# Patient Record
Sex: Male | Born: 1945 | Race: Black or African American | Hispanic: No | Marital: Married | Smoking: Former smoker
Health system: Southern US, Community
[De-identification: ages and names within clinical notes are randomized; demographics above are authoritative.]

## PROBLEM LIST (undated history)

## (undated) DIAGNOSIS — T7840XA Allergy, unspecified, initial encounter: Secondary | ICD-10-CM

## (undated) DIAGNOSIS — M1712 Unilateral primary osteoarthritis, left knee: Secondary | ICD-10-CM

## (undated) DIAGNOSIS — N189 Chronic kidney disease, unspecified: Secondary | ICD-10-CM

## (undated) DIAGNOSIS — I10 Essential (primary) hypertension: Secondary | ICD-10-CM

## (undated) DIAGNOSIS — D649 Anemia, unspecified: Secondary | ICD-10-CM

## (undated) DIAGNOSIS — N429 Disorder of prostate, unspecified: Secondary | ICD-10-CM

## (undated) HISTORY — DX: Chronic kidney disease, unspecified: N18.9

## (undated) HISTORY — PX: KNEE ARTHROSCOPY: SHX127

---

## 2014-05-10 NOTE — Patient Instructions (Signed)
Brent Payne  05/10/2014   Your procedure is scheduled on:  05/16/2014  Report to [facility] at 10:30 AM.  Call this number if you have problems the morning of surgery: [phone]   Remember:   Do not eat food or drink liquids after midnight.   Take these medicines the morning of surgery with A SIP OF WATER:Flomax   Do not wear jewelry, make-up or nail polish.  Do not wear lotions, powders, or perfumes. You may wear deodorant.  Do not shave 48 hours prior to surgery. Men may shave face and neck.  Do not bring valuables to the hospital.  [facility] is not responsible for any belongings or valuables.               Contacts, dentures or bridgework may not be worn into surgery.  Leave suitcase in the car. After surgery it may be brought to your room.  For patients admitted to the hospital, discharge time is determined by your treatment team.               Patients discharged the day of surgery will not be allowed to drive home.  Name and phone number of your driver:   Special Instructions: N/A   Please read over the following fact sheets that you were given: Anesthesia Post-op Instructions   PATIENT INSTRUCTIONS POST-ANESTHESIA  IMMEDIATELY FOLLOWING SURGERY:  Do not drive or operate machinery for the first twenty four hours after surgery.  Do not make any important decisions for twenty four hours after surgery or while taking narcotic pain medications or sedatives.  If you develop intractable nausea and vomiting or a severe headache please notify your doctor immediately.  FOLLOW-UP:  Please make an appointment with your surgeon as instructed. You do not need to follow up with anesthesia unless specifically instructed to do so.  WOUND CARE INSTRUCTIONS (if applicable):  Keep a dry clean dressing on the anesthesia/puncture wound site if there is drainage.  Once the wound has quit draining you may leave it open to air.  Generally you should leave the bandage intact for twenty four  hours unless there is drainage.  If the epidural site drains for more than 36-48 hours please call the anesthesia department.  QUESTIONS?:  Please feel free to call your physician or the hospital operator if you have any questions, and they will be happy to assist you.      Cataract Surgery  A cataract is a clouding of the lens of the eye. When a lens becomes cloudy, vision is reduced based on the degree and nature of the clouding. Surgery may be needed to improve vision. Surgery removes the cloudy lens and usually replaces it with a substitute lens (intraocular lens, IOL). LET YOUR EYE DOCTOR KNOW ABOUT:  Allergies to food or medicine.  Medicines taken including herbs, eye drops, over-the-counter medicines, and creams.  Use of steroids (by mouth or creams).  Previous problems with anesthetics or numbing medicine.  History of bleeding problems or blood clots.  Previous surgery.  Other health problems, including diabetes and kidney problems.  Possibility of pregnancy, if this applies. RISKS AND COMPLICATIONS  Infection.  Inflammation of the eyeball (endophthalmitis) that can spread to both eyes (sympathetic ophthalmia).  Poor wound healing.  If an IOL is inserted, it can later fall out of proper position. This is very uncommon.  Clouding of the part of your eye that holds an IOL in place. This is called an "after-cataract." These are  uncommon but easily treated. BEFORE THE PROCEDURE  Do not eat or drink anything except small amounts of water for 8 to 12 before your surgery, or as directed by your caregiver.  Unless you are told otherwise, continue any eye drops you have been prescribed.  Talk to your primary caregiver about all other medicines that you take (both prescription and nonprescription). In some cases, you may need to stop or change medicines near the time of your surgery. This is most important if you are taking blood-thinning medicine.Do not stop medicines unless  you are told to do so.  Arrange for someone to drive you to and from the procedure.  Do not put contact lenses in either eye on the day of your surgery. PROCEDURE There is more than one method for safely removing a cataract. Your doctor can explain the differences and help determine which is best for you. Phacoemulsification surgery is the most common form of cataract surgery.  An injection is given behind the eye or eye drops are given to make this a painless procedure.  A small cut (incision) is made on the edge of the clear, dome-shaped surface that covers the front of the eye (cornea).  A tiny probe is painlessly inserted into the eye. This device gives off ultrasound waves that soften and break up the cloudy center of the lens. This makes it easier for the cloudy lens to be removed by suction.  An IOL may be implanted.  The normal lens of the eye is covered by a clear capsule. Part of that capsule is intentionally left in the eye to support the IOL.  Your surgeon may or may not use stitches to close the incision. There are other forms of cataract surgery that require a larger incision and stitches to close the eye. This approach is taken in cases where the doctor feels that the cataract cannot be easily removed using phacoemulsification. AFTER THE PROCEDURE  When an IOL is implanted, it does not need care. It becomes a permanent part of your eye and cannot be seen or felt.  Your doctor will schedule follow-up exams to check on your progress.  Review your other medicines with your doctor to see which can be resumed after surgery.  Use eye drops or take medicine as prescribed by your doctor. Document Released: 12/26/2010 Document Revised: 05/23/2013 Document Reviewed: 12/26/2010 [facility] Patient Information 2015 [place], [place]. This information is not intended to replace advice given to you by your health care provider. Make sure you discuss any questions you have with your health  care provider.

## 2014-05-11 ENCOUNTER — Other Ambulatory Visit: Payer: Self-pay

## 2014-05-11 ENCOUNTER — Encounter (HOSPITAL_COMMUNITY)
Admission: RE | Admit: 2014-05-11 | Discharge: 2014-05-11 | Disposition: A | Discharge status: Home / Self Care | Payer: Commercial Managed Care - HMO | Source: Ambulatory Visit | Attending: Ophthalmology | Admitting: Ophthalmology

## 2014-05-11 ENCOUNTER — Encounter (HOSPITAL_COMMUNITY): Payer: Self-pay

## 2014-05-11 DIAGNOSIS — Z01818 Encounter for other preprocedural examination: Secondary | ICD-10-CM | POA: Diagnosis present

## 2014-05-11 LAB — BASIC METABOLIC PANEL
Anion gap: 5 (ref 5–15)
BUN: 10 mg/dL (ref 6–23)
CO2: 28 mmol/L (ref 19–32)
CREATININE: 1.08 mg/dL (ref 0.50–1.35)
Calcium: 9.1 mg/dL (ref 8.4–10.5)
Chloride: 106 mmol/L (ref 96–112)
GFR calc non Af Amer: 68 mL/min — ABNORMAL LOW (ref >90)
GFR, EST AFRICAN AMERICAN: 78 mL/min — AB (ref >90)
Glucose, Bld: 110 mg/dL — ABNORMAL HIGH (ref 70–99)
Potassium: 4.4 mmol/L (ref 3.5–5.1)
Sodium: 139 mmol/L (ref 135–145)

## 2014-05-11 LAB — CBC
HCT: 39.2 % (ref 39.0–52.0)
Hemoglobin: 12.7 g/dL — ABNORMAL LOW (ref 13.0–17.0)
MCH: 30.9 pg (ref 26.0–34.0)
MCHC: 32.4 g/dL (ref 30.0–36.0)
MCV: 95.4 fL (ref 78.0–100.0)
PLATELETS: 277 10*3/uL (ref 150–400)
RBC: 4.11 MIL/uL — AB (ref 4.22–5.81)
RDW: 15.7 % — ABNORMAL HIGH (ref 11.5–15.5)
WBC: 12 10*3/uL — ABNORMAL HIGH (ref 4.0–10.5)

## 2014-05-15 MED ORDER — TETRACAINE HCL 0.5 % OP SOLN
OPHTHALMIC | Status: AC
  Filled 2014-05-15: qty 2

## 2014-05-15 MED ORDER — CYCLOPENTOLATE-PHENYLEPHRINE OP SOLN OPTIME - NO CHARGE
OPHTHALMIC | Status: AC
  Filled 2014-05-15: qty 2

## 2014-05-15 MED ORDER — PHENYLEPHRINE HCL 2.5 % OP SOLN
OPHTHALMIC | Status: AC
  Filled 2014-05-15: qty 15

## 2014-05-15 MED ORDER — KETOROLAC TROMETHAMINE 0.5 % OP SOLN
OPHTHALMIC | Status: AC
  Filled 2014-05-15: qty 5

## 2014-05-16 ENCOUNTER — Ambulatory Visit (HOSPITAL_COMMUNITY)
Admission: RE | Admit: 2014-05-16 | Discharge: 2014-05-16 | Disposition: A | Discharge status: Home / Self Care | Payer: Commercial Managed Care - HMO | Source: Ambulatory Visit | Attending: Ophthalmology | Admitting: Ophthalmology

## 2014-05-16 ENCOUNTER — Encounter (HOSPITAL_COMMUNITY): Payer: Self-pay | Admitting: *Deleted

## 2014-05-16 ENCOUNTER — Ambulatory Visit (HOSPITAL_COMMUNITY): Payer: Commercial Managed Care - HMO | Admitting: Anesthesiology

## 2014-05-16 ENCOUNTER — Encounter (HOSPITAL_COMMUNITY)
Admission: RE | Disposition: A | Discharge status: Home / Self Care | Payer: Self-pay | Source: Ambulatory Visit | Attending: Ophthalmology

## 2014-05-16 DIAGNOSIS — H268 Other specified cataract: Secondary | ICD-10-CM | POA: Diagnosis present

## 2014-05-16 HISTORY — PX: CATARACT EXTRACTION W/PHACO: SHX586

## 2014-05-16 SURGERY — PHACOEMULSIFICATION, CATARACT, WITH IOL INSERTION
Anesthesia: Monitor Anesthesia Care | Site: Eye | Laterality: Right

## 2014-05-16 MED ORDER — PHENYLEPHRINE HCL 2.5 % OP SOLN
1.0000 [drp] | OPHTHALMIC | Status: AC
  Administered 2014-05-16 (×3): 1 [drp] via OPHTHALMIC

## 2014-05-16 MED ORDER — FENTANYL CITRATE (PF) 100 MCG/2ML IJ SOLN: 25.0000 ug | INTRAMUSCULAR | Status: DC | PRN

## 2014-05-16 MED ORDER — FENTANYL CITRATE (PF) 100 MCG/2ML IJ SOLN
25.0000 ug | INTRAMUSCULAR | Status: AC
  Administered 2014-05-16: 25 ug via INTRAVENOUS

## 2014-05-16 MED ORDER — CYCLOPENTOLATE-PHENYLEPHRINE 0.2-1 % OP SOLN
1.0000 [drp] | OPHTHALMIC | Status: AC
  Administered 2014-05-16 (×3): 1 [drp] via OPHTHALMIC

## 2014-05-16 MED ORDER — TETRACAINE HCL 0.5 % OP SOLN
1.0000 [drp] | OPHTHALMIC | Status: AC
  Administered 2014-05-16 (×3): 1 [drp] via OPHTHALMIC

## 2014-05-16 MED ORDER — EPINEPHRINE HCL 1 MG/ML IJ SOLN
INTRAMUSCULAR | Status: AC
  Filled 2014-05-16: qty 1

## 2014-05-16 MED ORDER — MIDAZOLAM HCL 2 MG/2ML IJ SOLN
1.0000 mg | INTRAMUSCULAR | Status: DC | PRN
  Administered 2014-05-16: 2 mg via INTRAVENOUS

## 2014-05-16 MED ORDER — MEPERIDINE HCL 50 MG/ML IJ SOLN: 6.2500 mg | Freq: Once | INTRAMUSCULAR | Status: DC

## 2014-05-16 MED ORDER — KETOROLAC TROMETHAMINE 0.5 % OP SOLN
1.0000 [drp] | OPHTHALMIC | Status: AC
  Administered 2014-05-16 (×3): 1 [drp] via OPHTHALMIC

## 2014-05-16 MED ORDER — LACTATED RINGERS IV SOLN
INTRAVENOUS | Status: DC
  Administered 2014-05-16: 11:00:00 via INTRAVENOUS

## 2014-05-16 MED ORDER — EPINEPHRINE HCL 1 MG/ML IJ SOLN
INTRAOCULAR | Status: DC | PRN
  Administered 2014-05-16: 500 mL

## 2014-05-16 MED ORDER — PROVISC 10 MG/ML IO SOLN
INTRAOCULAR | Status: DC | PRN
  Administered 2014-05-16: 0.85 mL via INTRAOCULAR

## 2014-05-16 MED ORDER — FENTANYL CITRATE (PF) 100 MCG/2ML IJ SOLN
INTRAMUSCULAR | Status: AC
  Filled 2014-05-16: qty 2

## 2014-05-16 MED ORDER — ONDANSETRON HCL 4 MG/2ML IJ SOLN: 4.0000 mg | Freq: Once | INTRAMUSCULAR | Status: DC | PRN

## 2014-05-16 MED ORDER — BSS IO SOLN
INTRAOCULAR | Status: DC | PRN
  Administered 2014-05-16: 15 mL

## 2014-05-16 MED ORDER — MIDAZOLAM HCL 2 MG/2ML IJ SOLN
INTRAMUSCULAR | Status: AC
  Filled 2014-05-16: qty 2

## 2014-05-16 SURGICAL SUPPLY — 8 items
CLOTH BEACON ORANGE TIMEOUT ST (SAFETY) ×2 IMPLANT
EYE SHIELD UNIVERSAL CLEAR (GAUZE/BANDAGES/DRESSINGS) ×2 IMPLANT
GLOVE BIO SURGEON STRL SZ 6.5 (GLOVE) ×2 IMPLANT
GLOVE EXAM NITRILE MD LF STRL (GLOVE) ×2 IMPLANT
LENS ALC ACRYL/TECN (Ophthalmic Related) ×2 IMPLANT
PAD ARMBOARD 7.5X6 YLW CONV (MISCELLANEOUS) ×2 IMPLANT
TAPE TRANSPORE STRL 2 31045 (GAUZE/BANDAGES/DRESSINGS) ×2 IMPLANT
WATER STERILE IRR 250ML POUR (IV SOLUTION) ×2 IMPLANT

## 2014-05-16 NOTE — Anesthesia Preprocedure Evaluation (Signed)
Anesthesia Evaluation  Patient identified by MRN, date of birth, ID band Patient awake    Reviewed: Allergy & Precautions, NPO status , Patient's Chart, lab work & pertinent test results  Airway Mallampati: II  TM Distance: >3 FB     Dental  (+) Teeth Intact   Pulmonary former smoker,  breath sounds clear to auscultation        Cardiovascular negative cardio ROS  Rhythm:Regular Rate:Normal     Neuro/Psych    GI/Hepatic negative GI ROS,   Endo/Other    Renal/GU      Musculoskeletal   Abdominal   Peds  Hematology   Anesthesia Other Findings   Reproductive/Obstetrics                             Anesthesia Physical Anesthesia Plan  ASA: I  Anesthesia Plan: MAC   Post-op Pain Management:    Induction: Intravenous  Airway Management Planned: Nasal Cannula  Additional Equipment:   Intra-op Plan:   Post-operative Plan:   Informed Consent: I have reviewed the patients History and Physical, chart, labs and discussed the procedure including the risks, benefits and alternatives for the proposed anesthesia with the patient or authorized representative who has indicated his/her understanding and acceptance.     Plan Discussed with:   Anesthesia Plan Comments:         Anesthesia Quick Evaluation

## 2014-05-16 NOTE — H&P (Signed)
The patient was re examined and there is no change in the patients condition since the original H and P. 

## 2014-05-16 NOTE — Anesthesia Postprocedure Evaluation (Signed)
  Anesthesia Post-op Note  Patient: Brent Payne  Procedure(s) Performed: Procedure(s) (LRB): CATARACT EXTRACTION PHACO AND INTRAOCULAR LENS PLACEMENT (IOC) (Right)  Patient Location:  Short Stay  Anesthesia Type: MAC  Level of Consciousness: awake  Airway and Oxygen Therapy: Patient Spontanous Breathing  Post-op Pain: none  Post-op Assessment: Post-op Vital signs reviewed, Patient's Cardiovascular Status Stable, Respiratory Function Stable, Patent Airway, No signs of Nausea or vomiting and Pain level controlled  Post-op Vital Signs: Reviewed and stable  Complications: No apparent anesthesia complications

## 2014-05-16 NOTE — Discharge Instructions (Signed)
VIDA EARLYWINE  05/16/2014           [facility] Inst[facility] [place] [id number]      1. Avoid closing eyes tightly. One often closes the eye tightly when laughing, talking, sneezing, coughing or if they feel irritated. At these times, you should be careful not to close your eyes tightly.  2. Instill eye drops as instructed. To instill drops in your eye, open it, look up and have someone gently pull the lower lid down and instill a couple of drops inside the lower lid.  3. Do not touch upper lid.  4. Take Advil or Tylenol for pain.  5. You may use either eye for near work, such as reading or sewing and you may watch television.  6. You may have your hair done at the beauty parlor at any time.  7. Wear dark glasses with or without your own glasses if you are in bright light.  8. Call our office at [phone] or [phone] if you have sharp pain in your eye or unusual symptoms.  9. Do not be concerned because vision in the operative eye is not good. It will not be good, no matter how successful the operation, until you get a special lens for it. Your old glasses will not be suited to the new eye that was operated on and you will not be ready for a new lens for about a month.  10. Follow up at the [facility] office.  Today between 2 and 3:00    I have received a copy of the above instructions and will follow them.    PATIENT INSTRUCTIONS POST-ANESTHESIA  IMMEDIATELY FOLLOWING SURGERY:  Do not drive or operate machinery for the first twenty four hours after surgery.  Do not make any important decisions for twenty four hours after surgery or while taking narcotic pain medications or sedatives.  If you develop intractable nausea and vomiting or a severe headache please notify your doctor immediately.  FOLLOW-UP:  Please make an appointment with your surgeon as instructed. You do not need to follow up with anesthesia unless  specifically instructed to do so.  WOUND CARE INSTRUCTIONS (if applicable):  Keep a dry clean dressing on the anesthesia/puncture wound site if there is drainage.  Once the wound has quit draining you may leave it open to air.  Generally you should leave the bandage intact for twenty four hours unless there is drainage.  If the epidural site drains for more than 36-48 hours please call the anesthesia department.  QUESTIONS?:  Please feel free to call your physician or the hospital operator if you have any questions, and they will be happy to assist you.

## 2014-05-16 NOTE — Op Note (Signed)
Patient brought to the operating room and prepped and draped in the usual manner.  Lid speculum inserted in right eye.  Stab incision made at the twelve o'clock position.  Provisc instilled in the anterior chamber.   A 2.4 mm. Stab incision was made temporally.  An anterior capsulotomy was done with a bent 25 gauge needle.  The nucleus was hydrodissected.  The Phaco tip was inserted in the anterior chamber and the nucleus was emulsified.  CDE was 6.38.  The cortical material was then removed with the I and A tip.  Posterior capsule was the polished.  The anterior chamber was deepened with Provisc.  A 22.5 Diopter Alcon [id number] IOL was then inserted in the capsular bag.  Provisc was then removed with the I and A tip.  The wound was then hydrated.  Patient sent to the Recovery Room in good condition with follow up in my office.  Preoperative Diagnosis:  Nuclear Cataract OD Postoperative Diagnosis:  Same Procedure name: [facility] Phacoemulsification OD with IOL

## 2014-05-16 NOTE — Transfer of Care (Signed)
Immediate Anesthesia Transfer of Care Note  Patient: Brent Payne  Procedure(s) Performed: Procedure(s) (LRB): CATARACT EXTRACTION PHACO AND INTRAOCULAR LENS PLACEMENT (IOC) (Right)  Patient Location: Shortstay  Anesthesia Type: MAC  Level of Consciousness: awake  Airway & Oxygen Therapy: Patient Spontanous Breathing   Post-op Assessment: Report given to PACU RN, Post -op Vital signs reviewed and stable and Patient moving all extremities  Post vital signs: Reviewed and stable  Complications: No apparent anesthesia complications

## 2014-05-16 NOTE — Anesthesia Procedure Notes (Signed)
Procedure Name: MAC Date/Time: 05/16/2014 11:23 AM Performed by: Vista Deck Pre-anesthesia Checklist: Patient identified, Emergency Drugs available, Suction available, Timeout performed and Patient being monitored Patient Re-evaluated:Patient Re-evaluated prior to inductionOxygen Delivery Method: Nasal Cannula

## 2014-05-17 ENCOUNTER — Encounter (HOSPITAL_COMMUNITY): Payer: Self-pay | Admitting: Ophthalmology

## 2016-03-24 DIAGNOSIS — M2342 Loose body in knee, left knee: Secondary | ICD-10-CM | POA: Diagnosis not present

## 2016-03-24 DIAGNOSIS — M1712 Unilateral primary osteoarthritis, left knee: Secondary | ICD-10-CM | POA: Diagnosis not present

## 2016-03-24 DIAGNOSIS — S83212D Bucket-handle tear of medial meniscus, current injury, left knee, subsequent encounter: Secondary | ICD-10-CM | POA: Diagnosis not present

## 2016-03-28 DIAGNOSIS — S83252A Bucket-handle tear of lateral meniscus, current injury, left knee, initial encounter: Secondary | ICD-10-CM | POA: Diagnosis not present

## 2016-03-28 DIAGNOSIS — M1712 Unilateral primary osteoarthritis, left knee: Secondary | ICD-10-CM | POA: Diagnosis not present

## 2016-03-28 DIAGNOSIS — M25562 Pain in left knee: Secondary | ICD-10-CM | POA: Diagnosis not present

## 2016-04-01 DIAGNOSIS — S83212D Bucket-handle tear of medial meniscus, current injury, left knee, subsequent encounter: Secondary | ICD-10-CM | POA: Diagnosis not present

## 2016-04-02 DIAGNOSIS — H348312 Tributary (branch) retinal vein occlusion, right eye, stable: Secondary | ICD-10-CM | POA: Diagnosis not present

## 2016-04-02 DIAGNOSIS — H2512 Age-related nuclear cataract, left eye: Secondary | ICD-10-CM | POA: Diagnosis not present

## 2016-04-02 DIAGNOSIS — H353113 Nonexudative age-related macular degeneration, right eye, advanced atrophic without subfoveal involvement: Secondary | ICD-10-CM | POA: Diagnosis not present

## 2016-04-02 DIAGNOSIS — H35031 Hypertensive retinopathy, right eye: Secondary | ICD-10-CM | POA: Diagnosis not present

## 2016-04-08 DIAGNOSIS — C61 Malignant neoplasm of prostate: Secondary | ICD-10-CM | POA: Diagnosis not present

## 2016-04-08 DIAGNOSIS — M179 Osteoarthritis of knee, unspecified: Secondary | ICD-10-CM | POA: Diagnosis not present

## 2016-04-08 DIAGNOSIS — Z0000 Encounter for general adult medical examination without abnormal findings: Secondary | ICD-10-CM | POA: Diagnosis not present

## 2016-04-08 DIAGNOSIS — N400 Enlarged prostate without lower urinary tract symptoms: Secondary | ICD-10-CM | POA: Diagnosis not present

## 2016-04-08 DIAGNOSIS — H269 Unspecified cataract: Secondary | ICD-10-CM | POA: Diagnosis not present

## 2016-04-08 DIAGNOSIS — I10 Essential (primary) hypertension: Secondary | ICD-10-CM | POA: Diagnosis not present

## 2016-04-08 DIAGNOSIS — E785 Hyperlipidemia, unspecified: Secondary | ICD-10-CM | POA: Diagnosis not present

## 2016-04-30 ENCOUNTER — Encounter: Payer: Self-pay | Admitting: Physician Assistant

## 2016-04-30 DIAGNOSIS — N429 Disorder of prostate, unspecified: Secondary | ICD-10-CM | POA: Diagnosis present

## 2016-04-30 DIAGNOSIS — I10 Essential (primary) hypertension: Secondary | ICD-10-CM | POA: Diagnosis present

## 2016-04-30 DIAGNOSIS — M1712 Unilateral primary osteoarthritis, left knee: Secondary | ICD-10-CM | POA: Diagnosis present

## 2016-04-30 NOTE — H&P (Signed)
TOTAL KNEE ADMISSION H&P  Patient is being admitted for left total knee arthroplasty.  Subjective:  Chief Complaint:left knee pain.  HPI: Brent Payne, 71 y.o. male, has a history of pain and functional disability in the left knee due to arthritis and has failed non-surgical conservative treatments for greater than 12 weeks to includeNSAID's and/or analgesics, corticosteriod injections, viscosupplementation injections, flexibility and strengthening excercises, supervised PT with diminished ADL's post treatment, use of assistive devices, weight reduction as appropriate and activity modification.  Onset of symptoms was gradual, starting 10 years ago with gradually worsening course since that time. The patient noted prior procedures on the knee to include  arthroscopy and menisectomy on the left knee(s).  Patient currently rates pain in the left knee(s) at 10 out of 10 with activity. Patient has night pain, worsening of pain with activity and weight bearing, pain that interferes with activities of daily living, crepitus and joint swelling.  Patient has evidence of subchondral sclerosis, periarticular osteophytes and joint space narrowing by imaging studies.  There is no active infection.  Patient Active Problem List   Diagnosis Date Noted  . Hypertension   . Prostate disease   . Primary localized osteoarthritis of left knee    Past Medical History:  Diagnosis Date  . Hypertension   . Primary localized osteoarthritis of left knee   . Primary localized osteoarthritis of left knee   . Prostate disease     Past Surgical History:  Procedure Laterality Date  . CATARACT EXTRACTION W/PHACO Right 05/16/2014   Procedure: CATARACT EXTRACTION PHACO AND INTRAOCULAR LENS PLACEMENT (IOC);  Surgeon: Rutherford Guys, MD;  Location: [facility];  Service: Ophthalmology;  Laterality: Right;  CDE:6.38  . KNEE ARTHROSCOPY Left    30+ years ago    No current facility-administered medications for this encounter.    Current Outpatient Prescriptions:  .  acetaminophen (TYLENOL) 500 MG tablet, Take 1,000 mg by mouth every 6 (six) hours as needed for mild pain., Disp: , Rfl:  .  loratadine (CLARITIN) 10 MG tablet, Take 10 mg by mouth daily., Disp: , Rfl:  .  losartan-hydrochlorothiazide (HYZAAR) 50-12.5 MG tablet, Take 1 tablet by mouth every evening., Disp: , Rfl:  .  naproxen sodium (ANAPROX) 220 MG tablet, Take 440 mg by mouth daily as needed (headache)., Disp: , Rfl:  .  tamsulosin (FLOMAX) 0.4 MG CAPS capsule, Take 0.4 mg by mouth daily after supper. , Disp: , Rfl:  No Known Allergies  Social History  Substance Use Topics  . Smoking status: Former Smoker    Packs/day: 0.50    Quit date: 05/11/2003  . Smokeless tobacco: Never Used  . Alcohol use No    Family History  Problem Relation Age of Onset  . Other Mother   . Hypertension Mother   . Other Father   . Hypertension Father      Review of Systems  Constitutional: Negative.   HENT: Negative.   Eyes: Negative.   Respiratory: Positive for cough and sputum production.   Cardiovascular: Negative.   Gastrointestinal: Negative.   Genitourinary: Negative.   Musculoskeletal: Positive for back pain and joint pain.  Skin: Negative.   Neurological: Negative.   Endo/Heme/Allergies: Negative.   Psychiatric/Behavioral: Negative.     Objective:  Physical Exam  Constitutional: He is oriented to person, place, and time. He appears well-developed and well-nourished.  HENT:  Head: Normocephalic and atraumatic.  Eyes: Conjunctivae are normal. Pupils are equal, round, and reactive to light.  Neck: Neck supple.  Cardiovascular: Normal rate and regular rhythm.   Respiratory: Effort normal and breath sounds normal.  GI: Soft. Bowel sounds are normal.  Genitourinary:  Genitourinary Comments: Not pertinent to current symptomatology therefore not examined.  Musculoskeletal:  Examination of his left knee reveals pain laterally and medially.   Moderate valgus deformity.  1+ effusion.  Range of motion from -5 to 125 degrees.  Knee is stable with normal patella tracking and pain.  Examination of his right knee reveals full range of motion without pain, swelling, weakness or instability.  Vascular exam: Pulses are 2+ and symmetric.  Neurologic exam: Distal motor and sensory examination is within normal limits.    Neurological: He is alert and oriented to person, place, and time.  Skin: Skin is warm and dry.  Psychiatric: He has a normal mood and affect. His behavior is normal.    Vital signs in last 24 hours: Temp:  [97.5 F (36.4 C)] 97.5 F (36.4 C) (04/11 1000) Pulse Rate:  [54] 54 (04/11 1000) BP: (145)/(74) 145/74 (04/11 1000) SpO2:  [97 %] 97 % (04/11 1000) Weight:  [78.9 kg (174 lb)] 78.9 kg (174 lb) (04/11 1000)  Labs:   Estimated body mass index is 26.46 kg/m as calculated from the following:   Height as of this encounter: 5\' 8"  (1.727 m).   Weight as of this encounter: 78.9 kg (174 lb).   Imaging Review Plain radiographs demonstrate severe degenerative joint disease of the left knee(s). The overall alignment issignificant valgus. The bone quality appears to be good for age and reported activity level.  Assessment/Plan:  End stage arthritis, left knee   The patient history, physical examination, clinical judgment of the provider and imaging studies are consistent with end stage degenerative joint disease of the left knee(s) and total knee arthroplasty is deemed medically necessary. The treatment options including medical management, injection therapy arthroscopy and arthroplasty were discussed at length. The risks and benefits of total knee arthroplasty were presented and reviewed. The risks due to aseptic loosening, infection, stiffness, patella tracking problems, thromboembolic complications and other imponderables were discussed. The patient acknowledged the explanation, agreed to proceed with the plan and consent  was signed. Patient is being admitted for inpatient treatment for surgery, pain control, PT, OT, prophylactic antibiotics, VTE prophylaxis, progressive ambulation and ADL's and discharge planning. The patient is planning to be discharged home with home health services

## 2016-05-02 ENCOUNTER — Encounter (HOSPITAL_COMMUNITY): Payer: Self-pay

## 2016-05-02 ENCOUNTER — Encounter (HOSPITAL_COMMUNITY)
Admission: RE | Admit: 2016-05-02 | Discharge: 2016-05-02 | Disposition: A | Discharge status: Home / Self Care | Payer: Medicare HMO | Source: Ambulatory Visit | Attending: Physician Assistant | Admitting: Physician Assistant

## 2016-05-02 ENCOUNTER — Encounter (HOSPITAL_COMMUNITY)
Admission: RE | Admit: 2016-05-02 | Discharge: 2016-05-02 | Disposition: A | Discharge status: Home / Self Care | Payer: Medicare HMO | Source: Ambulatory Visit | Attending: Orthopedic Surgery | Admitting: Orthopedic Surgery

## 2016-05-02 DIAGNOSIS — J069 Acute upper respiratory infection, unspecified: Secondary | ICD-10-CM | POA: Insufficient documentation

## 2016-05-02 DIAGNOSIS — Z01810 Encounter for preprocedural cardiovascular examination: Secondary | ICD-10-CM | POA: Insufficient documentation

## 2016-05-02 DIAGNOSIS — M1712 Unilateral primary osteoarthritis, left knee: Secondary | ICD-10-CM | POA: Diagnosis not present

## 2016-05-02 DIAGNOSIS — Z01818 Encounter for other preprocedural examination: Secondary | ICD-10-CM | POA: Insufficient documentation

## 2016-05-02 DIAGNOSIS — Z87891 Personal history of nicotine dependence: Secondary | ICD-10-CM | POA: Diagnosis not present

## 2016-05-02 DIAGNOSIS — Z01812 Encounter for preprocedural laboratory examination: Secondary | ICD-10-CM | POA: Insufficient documentation

## 2016-05-02 DIAGNOSIS — I10 Essential (primary) hypertension: Secondary | ICD-10-CM | POA: Insufficient documentation

## 2016-05-02 HISTORY — DX: Allergy, unspecified, initial encounter: T78.40XA

## 2016-05-02 LAB — CBC WITH DIFFERENTIAL/PLATELET
Basophils Absolute: 0 10*3/uL (ref 0.0–0.1)
Basophils Relative: 1 %
EOS ABS: 0.1 10*3/uL (ref 0.0–0.7)
EOS PCT: 3 %
HCT: 37 % — ABNORMAL LOW (ref 39.0–52.0)
Hemoglobin: 12.4 g/dL — ABNORMAL LOW (ref 13.0–17.0)
LYMPHS ABS: 1.2 10*3/uL (ref 0.7–4.0)
Lymphocytes Relative: 31 %
MCH: 29.2 pg (ref 26.0–34.0)
MCHC: 33.5 g/dL (ref 30.0–36.0)
MCV: 87.3 fL (ref 78.0–100.0)
MONO ABS: 0.2 10*3/uL (ref 0.1–1.0)
Monocytes Relative: 4 %
Neutro Abs: 2.4 10*3/uL (ref 1.7–7.7)
Neutrophils Relative %: 61 %
PLATELETS: 213 10*3/uL (ref 150–400)
RBC: 4.24 MIL/uL (ref 4.22–5.81)
RDW: 13.3 % (ref 11.5–15.5)
WBC: 3.9 10*3/uL — ABNORMAL LOW (ref 4.0–10.5)

## 2016-05-02 LAB — COMPREHENSIVE METABOLIC PANEL
ALT: 20 U/L (ref 17–63)
AST: 26 U/L (ref 15–41)
Albumin: 4.1 g/dL (ref 3.5–5.0)
Alkaline Phosphatase: 70 U/L (ref 38–126)
Anion gap: 7 (ref 5–15)
BUN: 13 mg/dL (ref 6–20)
CO2: 24 mmol/L (ref 22–32)
Calcium: 9.5 mg/dL (ref 8.9–10.3)
Chloride: 107 mmol/L (ref 101–111)
Creatinine, Ser: 1.51 mg/dL — ABNORMAL HIGH (ref 0.61–1.24)
GFR calc non Af Amer: 44 mL/min — ABNORMAL LOW (ref >60)
GFR, EST AFRICAN AMERICAN: 51 mL/min — AB (ref >60)
Glucose, Bld: 112 mg/dL — ABNORMAL HIGH (ref 65–99)
POTASSIUM: 3.6 mmol/L (ref 3.5–5.1)
SODIUM: 138 mmol/L (ref 135–145)
Total Bilirubin: 0.6 mg/dL (ref 0.3–1.2)
Total Protein: 8.1 g/dL (ref 6.5–8.1)

## 2016-05-02 LAB — SURGICAL PCR SCREEN
MRSA, PCR: NEGATIVE
Staphylococcus aureus: NEGATIVE

## 2016-05-02 LAB — PROTIME-INR
INR: 1.1
PROTHROMBIN TIME: 14.2 s (ref 11.4–15.2)

## 2016-05-02 LAB — TYPE AND SCREEN
ABO/RH(D): A POS
Antibody Screen: NEGATIVE

## 2016-05-02 LAB — ABO/RH: ABO/RH(D): A POS

## 2016-05-02 NOTE — OR Nursing (Signed)
PCP is Dr Rosita Fire Denies ever seeing a cardiologist. Denies ever having a card cath, Stress test, or echo. Denies any chest pain.

## 2016-05-02 NOTE — Pre-Procedure Instructions (Signed)
FLEET HIGHAM  05/02/2016      [facility] [address], [place] [address] Phone: [phone] Fax: [phone]    Your procedure is scheduled on April 23.  Report to [facility] Admitting at 1045 A.M.  Call this number if you have problems the morning of surgery:  [phone]   Remember:  Do not eat food or drink liquids after midnight.  Take these medicines the morning of surgery with A SIP OF WATER Loratadine (Claritin), Tylenol if needed  Stop taking aspirin, BC's, Goody's, Herbal medications, Fish Oil, Ibuprofen, Advil, Motrin, Naproxen (Anaprox), Aleve, Vitamins   Do not wear jewelry, make-up or nail polish.  Do not wear lotions, powders, or perfumes, or deoderant.  Do not shave 48 hours prior to surgery.  Men may shave face and neck.  Do not bring valuables to the hospital.  [facility] is not responsible for any belongings or valuables.  Contacts, dentures or bridgework may not be worn into surgery.  Leave your suitcase in the car.  After surgery it may be brought to your room.  For patients admitted to the hospital, discharge time will be determined by your treatment team.  Patients discharged the day of surgery will not be allowed to drive home.    Special instructions:  [facility] - Preparing for Surgery  Before surgery, you can play an important role.  Because skin is not sterile, your skin needs to be as free of germs as possible.  You can reduce the number of germs on you skin by washing with CHG (chlorahexidine gluconate) soap before surgery.  CHG is an antiseptic cleaner which kills germs and bonds with the skin to continue killing germs even after washing.  Please DO NOT use if you have an allergy to CHG or antibacterial soaps.  If your skin becomes reddened/irritated stop using the CHG and inform your nurse when you arrive at Short Stay.  Do not shave (including legs and underarms) for at least 48  hours prior to the first CHG shower.  You may shave your face.  Please follow these instructions carefully:   1.  Shower with CHG Soap the night before surgery and the    morning of Surgery.  2.  If you choose to wash your hair, wash your hair first as usual with your  normal shampoo.  3.  After you shampoo, rinse your hair and body thoroughly to remove the  Shampoo.  4.  Use CHG as you would any other liquid soap.  You can apply chg directly  to the skin and wash gently with scrungie or a clean washcloth.  5.  Apply the CHG Soap to your body ONLY FROM THE NECK DOWN.  Do not use on open wounds or open sores.  Avoid contact with your eyes, ears, mouth and genitals (private parts).  Wash genitals (private parts)   with your normal soap.  6.  Wash thoroughly, paying special attention to the area where your surgery will be performed.  7.  Thoroughly rinse your body with warm water from the neck down.  8.  DO NOT shower/wash with your normal soap after using and rinsing off   the CHG Soap.  9.  Pat yourself dry with a clean towel.            10.  Wear clean pajamas.  11.  Place clean sheets on your bed the night of your first shower and do not sleep with pets.  Day of Surgery  Do not apply any lotions/deoderants the morning of surgery.  Please wear clean clothes to the hospital/surgery center.     Please read over the following fact sheets that you were given. Pain Booklet, Coughing and Deep Breathing, MRSA Information and Surgical Site Infection Prevention, incentive spirometry

## 2016-05-03 LAB — URINE CULTURE: Culture: NO GROWTH

## 2016-05-06 NOTE — Progress Notes (Addendum)
Anesthesia Chart Review:  Pt is a 71 year old male scheduled for L total knee arthroplasty on 05/12/2016 with Elsie Saas, MD.   PCP is Conni Slipper, MD; last office visit 04/09/16 for clearance for surgery.   PMH includes:  HTN. Former smoker. BMI 25.5. S/p cataract extraction 05/16/14.   Medications include: Losartan-HCTZ  Preoperative labs reviewed. Cr 1.5, BUN 13. Prior Cr was 1.34 at PCP's office 04/09/16.  Will repeat DOS and route PAT results to PCP for follow up.   EKG 05/02/16: Sinus bradycardia (54 bpm). Moderate voltage criteria for LVH, may be normal variant  If labs acceptable DOS, I anticipate pt can proceed with surgery as scheduled.   Willeen Cass, FNP-BC [facility] Short Stay [facility]/Anesthesiology Phone: [phone] 05/07/2016 1:46 PM

## 2016-05-12 ENCOUNTER — Encounter (HOSPITAL_COMMUNITY): Payer: Self-pay | Admitting: *Deleted

## 2016-05-12 ENCOUNTER — Inpatient Hospital Stay (HOSPITAL_COMMUNITY)
Admission: RE | Admit: 2016-05-12 | Discharge: 2016-05-14 | DRG: 470 | Disposition: A | Discharge status: Home Health | Payer: Medicare HMO | Source: Ambulatory Visit | Attending: Orthopedic Surgery | Admitting: Orthopedic Surgery

## 2016-05-12 ENCOUNTER — Other Ambulatory Visit (HOSPITAL_COMMUNITY): Payer: Self-pay | Admitting: *Deleted

## 2016-05-12 ENCOUNTER — Inpatient Hospital Stay (HOSPITAL_COMMUNITY): Payer: Medicare HMO | Admitting: Emergency Medicine

## 2016-05-12 ENCOUNTER — Encounter (HOSPITAL_COMMUNITY)
Admission: RE | Disposition: A | Discharge status: Home Health | Payer: Self-pay | Source: Ambulatory Visit | Attending: Orthopedic Surgery

## 2016-05-12 DIAGNOSIS — N429 Disorder of prostate, unspecified: Secondary | ICD-10-CM | POA: Diagnosis present

## 2016-05-12 DIAGNOSIS — Z961 Presence of intraocular lens: Secondary | ICD-10-CM | POA: Diagnosis not present

## 2016-05-12 DIAGNOSIS — M25562 Pain in left knee: Secondary | ICD-10-CM | POA: Diagnosis present

## 2016-05-12 DIAGNOSIS — Z96652 Presence of left artificial knee joint: Secondary | ICD-10-CM | POA: Diagnosis not present

## 2016-05-12 DIAGNOSIS — Z9841 Cataract extraction status, right eye: Secondary | ICD-10-CM

## 2016-05-12 DIAGNOSIS — M1712 Unilateral primary osteoarthritis, left knee: Secondary | ICD-10-CM | POA: Diagnosis not present

## 2016-05-12 DIAGNOSIS — Z87891 Personal history of nicotine dependence: Secondary | ICD-10-CM | POA: Diagnosis not present

## 2016-05-12 DIAGNOSIS — I10 Essential (primary) hypertension: Secondary | ICD-10-CM | POA: Diagnosis not present

## 2016-05-12 DIAGNOSIS — D62 Acute posthemorrhagic anemia: Secondary | ICD-10-CM | POA: Diagnosis not present

## 2016-05-12 DIAGNOSIS — Z79899 Other long term (current) drug therapy: Secondary | ICD-10-CM

## 2016-05-12 DIAGNOSIS — G8918 Other acute postprocedural pain: Secondary | ICD-10-CM | POA: Diagnosis not present

## 2016-05-12 HISTORY — DX: Unilateral primary osteoarthritis, left knee: M17.12

## 2016-05-12 HISTORY — PX: TOTAL KNEE ARTHROPLASTY: SHX125

## 2016-05-12 HISTORY — DX: Essential (primary) hypertension: I10

## 2016-05-12 HISTORY — DX: Disorder of prostate, unspecified: N42.9

## 2016-05-12 LAB — BASIC METABOLIC PANEL
Anion gap: 8 (ref 5–15)
BUN: 13 mg/dL (ref 6–20)
CHLORIDE: 106 mmol/L (ref 101–111)
CO2: 25 mmol/L (ref 22–32)
CREATININE: 1.34 mg/dL — AB (ref 0.61–1.24)
Calcium: 9.4 mg/dL (ref 8.9–10.3)
GFR calc non Af Amer: 51 mL/min — ABNORMAL LOW (ref >60)
GFR, EST AFRICAN AMERICAN: 59 mL/min — AB (ref >60)
Glucose, Bld: 100 mg/dL — ABNORMAL HIGH (ref 65–99)
POTASSIUM: 3.8 mmol/L (ref 3.5–5.1)
Sodium: 139 mmol/L (ref 135–145)

## 2016-05-12 SURGERY — ARTHROPLASTY, KNEE, TOTAL
Anesthesia: Monitor Anesthesia Care | Site: Knee | Laterality: Left

## 2016-05-12 MED ORDER — ASPIRIN EC 325 MG PO TBEC
325.0000 mg | DELAYED_RELEASE_TABLET | Freq: Every day | ORAL | Status: DC
  Administered 2016-05-13 – 2016-05-14 (×2): 325 mg via ORAL
  Filled 2016-05-12 (×2): qty 1

## 2016-05-12 MED ORDER — BUPIVACAINE HCL (PF) 0.25 % IJ SOLN
INTRAMUSCULAR | Status: AC
  Filled 2016-05-12: qty 30

## 2016-05-12 MED ORDER — ALUM & MAG HYDROXIDE-SIMETH 200-200-20 MG/5ML PO SUSP: 30.0000 mL | ORAL | Status: DC | PRN

## 2016-05-12 MED ORDER — MIDAZOLAM HCL 2 MG/2ML IJ SOLN
2.0000 mg | Freq: Once | INTRAMUSCULAR | Status: AC
  Administered 2016-05-12: 2 mg via INTRAVENOUS

## 2016-05-12 MED ORDER — POLYETHYLENE GLYCOL 3350 17 G PO PACK
17.0000 g | PACK | Freq: Two times a day (BID) | ORAL | Status: DC
  Administered 2016-05-12 – 2016-05-14 (×4): 17 g via ORAL
  Filled 2016-05-12 (×4): qty 1

## 2016-05-12 MED ORDER — FENTANYL CITRATE (PF) 100 MCG/2ML IJ SOLN
50.0000 ug | Freq: Once | INTRAMUSCULAR | Status: AC
  Administered 2016-05-12: 50 ug via INTRAVENOUS

## 2016-05-12 MED ORDER — DEXAMETHASONE SODIUM PHOSPHATE 10 MG/ML IJ SOLN
INTRAMUSCULAR | Status: DC | PRN
  Administered 2016-05-12: 10 mg via INTRAVENOUS

## 2016-05-12 MED ORDER — LIDOCAINE 2% (20 MG/ML) 5 ML SYRINGE
INTRAMUSCULAR | Status: AC
  Filled 2016-05-12: qty 5

## 2016-05-12 MED ORDER — CHLORHEXIDINE GLUCONATE 4 % EX LIQD: 60.0000 mL | Freq: Once | CUTANEOUS | Status: DC

## 2016-05-12 MED ORDER — ONDANSETRON HCL 4 MG/2ML IJ SOLN
INTRAMUSCULAR | Status: DC | PRN
  Administered 2016-05-12: 4 mg via INTRAVENOUS

## 2016-05-12 MED ORDER — LORATADINE 10 MG PO TABS
10.0000 mg | ORAL_TABLET | Freq: Every day | ORAL | Status: DC
  Administered 2016-05-13 – 2016-05-14 (×2): 10 mg via ORAL
  Filled 2016-05-12 (×2): qty 1

## 2016-05-12 MED ORDER — PHENOL 1.4 % MT LIQD: 1.0000 | OROMUCOSAL | Status: DC | PRN

## 2016-05-12 MED ORDER — CEFAZOLIN SODIUM-DEXTROSE 2-4 GM/100ML-% IV SOLN
INTRAVENOUS | Status: AC
  Filled 2016-05-12: qty 100

## 2016-05-12 MED ORDER — PROPOFOL 500 MG/50ML IV EMUL
INTRAVENOUS | Status: DC | PRN
  Administered 2016-05-12: 50 ug/kg/min via INTRAVENOUS

## 2016-05-12 MED ORDER — PHENYLEPHRINE 40 MCG/ML (10ML) SYRINGE FOR IV PUSH (FOR BLOOD PRESSURE SUPPORT)
PREFILLED_SYRINGE | INTRAVENOUS | Status: AC
  Filled 2016-05-12: qty 10

## 2016-05-12 MED ORDER — BUPIVACAINE HCL (PF) 0.5 % IJ SOLN
INTRAMUSCULAR | Status: DC | PRN
  Administered 2016-05-12: 3 mL

## 2016-05-12 MED ORDER — BUPIVACAINE-EPINEPHRINE 0.25% -1:200000 IJ SOLN
INTRAMUSCULAR | Status: DC | PRN
  Administered 2016-05-12: 20 mL

## 2016-05-12 MED ORDER — ARTIFICIAL TEARS OP OINT
TOPICAL_OINTMENT | OPHTHALMIC | Status: AC
  Filled 2016-05-12: qty 3.5

## 2016-05-12 MED ORDER — POVIDONE-IODINE 7.5 % EX SOLN: Freq: Once | CUTANEOUS | Status: DC

## 2016-05-12 MED ORDER — CEFAZOLIN SODIUM-DEXTROSE 2-4 GM/100ML-% IV SOLN
2.0000 g | INTRAVENOUS | Status: AC
  Administered 2016-05-12: 2 g via INTRAVENOUS

## 2016-05-12 MED ORDER — SUGAMMADEX SODIUM 500 MG/5ML IV SOLN
INTRAVENOUS | Status: AC
  Filled 2016-05-12: qty 5

## 2016-05-12 MED ORDER — HYDROCHLOROTHIAZIDE 12.5 MG PO CAPS
12.5000 mg | ORAL_CAPSULE | Freq: Every day | ORAL | Status: DC
  Administered 2016-05-13 – 2016-05-14 (×2): 12.5 mg via ORAL
  Filled 2016-05-12 (×2): qty 1

## 2016-05-12 MED ORDER — LACTATED RINGERS IV SOLN
INTRAVENOUS | Status: DC
  Administered 2016-05-12 (×2): via INTRAVENOUS

## 2016-05-12 MED ORDER — ROCURONIUM BROMIDE 10 MG/ML (PF) SYRINGE
PREFILLED_SYRINGE | INTRAVENOUS | Status: AC
  Filled 2016-05-12: qty 5

## 2016-05-12 MED ORDER — MENTHOL 3 MG MT LOZG: 1.0000 | LOZENGE | OROMUCOSAL | Status: DC | PRN

## 2016-05-12 MED ORDER — DEXAMETHASONE SODIUM PHOSPHATE 10 MG/ML IJ SOLN
10.0000 mg | Freq: Three times a day (TID) | INTRAMUSCULAR | Status: AC
  Administered 2016-05-12 – 2016-05-13 (×4): 10 mg via INTRAVENOUS
  Filled 2016-05-12 (×4): qty 1

## 2016-05-12 MED ORDER — ROPIVACAINE HCL 5 MG/ML IJ SOLN
INTRAMUSCULAR | Status: DC | PRN
  Administered 2016-05-12: 25 mL via PERINEURAL

## 2016-05-12 MED ORDER — CEFAZOLIN SODIUM-DEXTROSE 2-4 GM/100ML-% IV SOLN
2.0000 g | Freq: Four times a day (QID) | INTRAVENOUS | Status: AC
  Administered 2016-05-12 (×2): 2 g via INTRAVENOUS
  Filled 2016-05-12 (×2): qty 100

## 2016-05-12 MED ORDER — PROPOFOL 1000 MG/100ML IV EMUL
INTRAVENOUS | Status: AC
  Filled 2016-05-12: qty 100

## 2016-05-12 MED ORDER — LOSARTAN POTASSIUM 50 MG PO TABS
50.0000 mg | ORAL_TABLET | Freq: Every day | ORAL | Status: DC
  Administered 2016-05-13 – 2016-05-14 (×2): 50 mg via ORAL
  Filled 2016-05-12 (×2): qty 1

## 2016-05-12 MED ORDER — FENTANYL CITRATE (PF) 100 MCG/2ML IJ SOLN
INTRAMUSCULAR | Status: AC
  Administered 2016-05-12: 50 ug via INTRAVENOUS
  Filled 2016-05-12: qty 2

## 2016-05-12 MED ORDER — ONDANSETRON HCL 4 MG/2ML IJ SOLN
INTRAMUSCULAR | Status: AC
  Filled 2016-05-12: qty 2

## 2016-05-12 MED ORDER — ACETAMINOPHEN 650 MG RE SUPP: 650.0000 mg | Freq: Four times a day (QID) | RECTAL | Status: DC | PRN

## 2016-05-12 MED ORDER — OXYCODONE HCL 5 MG PO TABS
5.0000 mg | ORAL_TABLET | ORAL | Status: DC | PRN
  Administered 2016-05-12 – 2016-05-14 (×7): 10 mg via ORAL
  Filled 2016-05-12 (×7): qty 2

## 2016-05-12 MED ORDER — POTASSIUM CHLORIDE IN NACL 20-0.9 MEQ/L-% IV SOLN: INTRAVENOUS | Status: DC

## 2016-05-12 MED ORDER — FENTANYL CITRATE (PF) 250 MCG/5ML IJ SOLN
INTRAMUSCULAR | Status: AC
  Filled 2016-05-12: qty 5

## 2016-05-12 MED ORDER — LOSARTAN POTASSIUM-HCTZ 50-12.5 MG PO TABS: 1.0000 | ORAL_TABLET | Freq: Every evening | ORAL | Status: DC

## 2016-05-12 MED ORDER — LACTATED RINGERS IV SOLN: INTRAVENOUS | Status: DC

## 2016-05-12 MED ORDER — FENTANYL CITRATE (PF) 100 MCG/2ML IJ SOLN
INTRAMUSCULAR | Status: DC | PRN
  Administered 2016-05-12 (×2): 25 ug via INTRAVENOUS
  Administered 2016-05-12: 50 ug via INTRAVENOUS

## 2016-05-12 MED ORDER — SODIUM CHLORIDE 0.9 % IR SOLN
Status: DC | PRN
  Administered 2016-05-12: 3000 mL

## 2016-05-12 MED ORDER — EPHEDRINE SULFATE 50 MG/ML IJ SOLN
INTRAMUSCULAR | Status: DC | PRN
  Administered 2016-05-12: 10 mg via INTRAVENOUS

## 2016-05-12 MED ORDER — HYDROMORPHONE HCL 1 MG/ML IJ SOLN
1.0000 mg | INTRAMUSCULAR | Status: DC | PRN
  Administered 2016-05-12 – 2016-05-13 (×2): 1 mg via INTRAVENOUS
  Filled 2016-05-12 (×2): qty 1

## 2016-05-12 MED ORDER — TRANEXAMIC ACID 1000 MG/10ML IV SOLN
1000.0000 mg | INTRAVENOUS | Status: AC
  Administered 2016-05-12: 1000 mg via INTRAVENOUS
  Filled 2016-05-12: qty 10

## 2016-05-12 MED ORDER — EPINEPHRINE PF 1 MG/ML IJ SOLN
INTRAMUSCULAR | Status: AC
  Filled 2016-05-12: qty 1

## 2016-05-12 MED ORDER — FENTANYL CITRATE (PF) 100 MCG/2ML IJ SOLN: 25.0000 ug | INTRAMUSCULAR | Status: DC | PRN

## 2016-05-12 MED ORDER — ONDANSETRON HCL 4 MG/2ML IJ SOLN: 4.0000 mg | Freq: Four times a day (QID) | INTRAMUSCULAR | Status: DC | PRN

## 2016-05-12 MED ORDER — TAMSULOSIN HCL 0.4 MG PO CAPS
0.4000 mg | ORAL_CAPSULE | Freq: Every day | ORAL | Status: DC
  Administered 2016-05-12 – 2016-05-13 (×2): 0.4 mg via ORAL
  Filled 2016-05-12 (×2): qty 1

## 2016-05-12 MED ORDER — ONDANSETRON HCL 4 MG PO TABS: 4.0000 mg | ORAL_TABLET | Freq: Four times a day (QID) | ORAL | Status: DC | PRN

## 2016-05-12 MED ORDER — PROPOFOL 10 MG/ML IV BOLUS
INTRAVENOUS | Status: AC
  Filled 2016-05-12: qty 40

## 2016-05-12 MED ORDER — PROPOFOL 500 MG/50ML IV EMUL
INTRAVENOUS | Status: AC
  Filled 2016-05-12: qty 50

## 2016-05-12 MED ORDER — METOCLOPRAMIDE HCL 5 MG PO TABS: 5.0000 mg | ORAL_TABLET | Freq: Three times a day (TID) | ORAL | Status: DC | PRN

## 2016-05-12 MED ORDER — METOCLOPRAMIDE HCL 5 MG/ML IJ SOLN: 5.0000 mg | Freq: Three times a day (TID) | INTRAMUSCULAR | Status: DC | PRN

## 2016-05-12 MED ORDER — 0.9 % SODIUM CHLORIDE (POUR BTL) OPTIME
TOPICAL | Status: DC | PRN
  Administered 2016-05-12: 1000 mL

## 2016-05-12 MED ORDER — ACETAMINOPHEN 500 MG PO TABS
1000.0000 mg | ORAL_TABLET | Freq: Four times a day (QID) | ORAL | Status: AC
Start: 2016-05-12 — End: 2016-05-13
  Administered 2016-05-12 – 2016-05-13 (×3): 1000 mg via ORAL
  Filled 2016-05-12 (×3): qty 2

## 2016-05-12 MED ORDER — MIDAZOLAM HCL 2 MG/2ML IJ SOLN
INTRAMUSCULAR | Status: AC
  Administered 2016-05-12: 2 mg via INTRAVENOUS
  Filled 2016-05-12: qty 2

## 2016-05-12 MED ORDER — DIPHENHYDRAMINE HCL 12.5 MG/5ML PO ELIX: 12.5000 mg | ORAL_SOLUTION | ORAL | Status: DC | PRN

## 2016-05-12 MED ORDER — LIDOCAINE HCL (CARDIAC) 20 MG/ML IV SOLN
INTRAVENOUS | Status: DC | PRN
  Administered 2016-05-12: 60 mg via INTRATRACHEAL

## 2016-05-12 MED ORDER — DOCUSATE SODIUM 100 MG PO CAPS
100.0000 mg | ORAL_CAPSULE | Freq: Two times a day (BID) | ORAL | Status: DC
  Administered 2016-05-12 – 2016-05-14 (×4): 100 mg via ORAL
  Filled 2016-05-12 (×4): qty 1

## 2016-05-12 MED ORDER — ACETAMINOPHEN 325 MG PO TABS
650.0000 mg | ORAL_TABLET | Freq: Four times a day (QID) | ORAL | Status: DC | PRN
  Administered 2016-05-14: 650 mg via ORAL
  Filled 2016-05-12: qty 2

## 2016-05-12 SURGICAL SUPPLY — 68 items
BANDAGE ACE 6X5 VEL STRL LF (GAUZE/BANDAGES/DRESSINGS) ×2 IMPLANT
BANDAGE ESMARK 6X9 LF (GAUZE/BANDAGES/DRESSINGS) ×1 IMPLANT
BENZOIN TINCTURE PRP APPL 2/3 (GAUZE/BANDAGES/DRESSINGS) ×2 IMPLANT
BLADE SAGITTAL 25.0X1.19X90 (BLADE) ×2 IMPLANT
BLADE SAW SGTL 13X75X1.27 (BLADE) ×2 IMPLANT
BLADE SURG 10 STRL SS (BLADE) ×4 IMPLANT
BNDG ELASTIC 6X15 VLCR STRL LF (GAUZE/BANDAGES/DRESSINGS) ×2 IMPLANT
BNDG ESMARK 6X9 LF (GAUZE/BANDAGES/DRESSINGS) ×2
BOWL SMART MIX CTS (DISPOSABLE) ×2 IMPLANT
CAPT KNEE TOTAL 3 ATTUNE ×2 IMPLANT
CEMENT HV SMART SET (Cement) ×4 IMPLANT
CLSR STERI-STRIP ANTIMIC 1/2X4 (GAUZE/BANDAGES/DRESSINGS) ×2 IMPLANT
COVER SURGICAL LIGHT HANDLE (MISCELLANEOUS) ×2 IMPLANT
CUFF TOURNIQUET SINGLE 34IN LL (TOURNIQUET CUFF) ×2 IMPLANT
CUFF TOURNIQUET SINGLE 44IN (TOURNIQUET CUFF) IMPLANT
DECANTER SPIKE VIAL GLASS SM (MISCELLANEOUS) ×2 IMPLANT
DRAPE EXTREMITY T 121X128X90 (DRAPE) ×2 IMPLANT
DRAPE HALF SHEET 40X57 (DRAPES) ×4 IMPLANT
DRAPE INCISE IOBAN 66X45 STRL (DRAPES) IMPLANT
DRAPE U-SHAPE 47X51 STRL (DRAPES) ×2 IMPLANT
DRSG AQUACEL AG ADV 3.5X14 (GAUZE/BANDAGES/DRESSINGS) ×2 IMPLANT
DURAPREP 26ML APPLICATOR (WOUND CARE) ×4 IMPLANT
ELECT CAUTERY BLADE 6.4 (BLADE) ×2 IMPLANT
ELECT REM PT RETURN 9FT ADLT (ELECTROSURGICAL) ×2
ELECTRODE REM PT RTRN 9FT ADLT (ELECTROSURGICAL) ×1 IMPLANT
FACESHIELD WRAPAROUND (MASK) ×2 IMPLANT
GLOVE BIO SURGEON STRL SZ7 (GLOVE) ×2 IMPLANT
GLOVE BIOGEL PI IND STRL 7.0 (GLOVE) ×1 IMPLANT
GLOVE BIOGEL PI IND STRL 7.5 (GLOVE) ×1 IMPLANT
GLOVE BIOGEL PI INDICATOR 7.0 (GLOVE) ×1
GLOVE BIOGEL PI INDICATOR 7.5 (GLOVE) ×1
GLOVE SS BIOGEL STRL SZ 7.5 (GLOVE) ×1 IMPLANT
GLOVE SUPERSENSE BIOGEL SZ 7.5 (GLOVE) ×1
GOWN STRL REUS W/ TWL LRG LVL3 (GOWN DISPOSABLE) ×1 IMPLANT
GOWN STRL REUS W/ TWL XL LVL3 (GOWN DISPOSABLE) ×2 IMPLANT
GOWN STRL REUS W/TWL LRG LVL3 (GOWN DISPOSABLE) ×1
GOWN STRL REUS W/TWL XL LVL3 (GOWN DISPOSABLE) ×2
HANDPIECE INTERPULSE COAX TIP (DISPOSABLE) ×1
HOOD PEEL AWAY FACE SHEILD DIS (HOOD) ×4 IMPLANT
IMMOBILIZER KNEE 22 UNIV (SOFTGOODS) ×2 IMPLANT
KIT BASIN OR (CUSTOM PROCEDURE TRAY) ×2 IMPLANT
KIT ROOM TURNOVER OR (KITS) ×2 IMPLANT
MANIFOLD NEPTUNE II (INSTRUMENTS) ×2 IMPLANT
MARKER SKIN DUAL TIP RULER LAB (MISCELLANEOUS) ×2 IMPLANT
NEEDLE 18GX1X1/2 (RX/OR ONLY) (NEEDLE) ×2 IMPLANT
NS IRRIG 1000ML POUR BTL (IV SOLUTION) ×2 IMPLANT
PACK TOTAL JOINT (CUSTOM PROCEDURE TRAY) ×2 IMPLANT
PAD ARMBOARD 7.5X6 YLW CONV (MISCELLANEOUS) ×4 IMPLANT
SET HNDPC FAN SPRY TIP SCT (DISPOSABLE) ×1 IMPLANT
STRIP CLOSURE SKIN 1/2X4 (GAUZE/BANDAGES/DRESSINGS) ×2 IMPLANT
SUCTION FRAZIER HANDLE 10FR (MISCELLANEOUS) ×1
SUCTION FRAZIER TIP 8 FR DISP (SUCTIONS) ×1
SUCTION TUBE FRAZIER 10FR DISP (MISCELLANEOUS) ×1 IMPLANT
SUCTION TUBE FRAZIER 8FR DISP (SUCTIONS) ×1 IMPLANT
SUT MNCRL AB 3-0 PS2 18 (SUTURE) ×2 IMPLANT
SUT VIC AB 0 CT1 27 (SUTURE) ×2
SUT VIC AB 0 CT1 27XBRD ANBCTR (SUTURE) ×2 IMPLANT
SUT VIC AB 1 CT1 27 (SUTURE) ×1
SUT VIC AB 1 CT1 27XBRD ANBCTR (SUTURE) ×1 IMPLANT
SUT VIC AB 2-0 CT1 27 (SUTURE) ×2
SUT VIC AB 2-0 CT1 TAPERPNT 27 (SUTURE) ×2 IMPLANT
SYR 30ML LL (SYRINGE) ×2 IMPLANT
TOWEL OR 17X24 6PK STRL BLUE (TOWEL DISPOSABLE) ×2 IMPLANT
TOWEL OR 17X26 10 PK STRL BLUE (TOWEL DISPOSABLE) ×2 IMPLANT
TRAY CATH 16FR W/PLASTIC CATH (SET/KITS/TRAYS/PACK) IMPLANT
TRAY FOLEY CATH SILVER 16FR (SET/KITS/TRAYS/PACK) ×2 IMPLANT
TUBE CONNECTING 12X1/4 (SUCTIONS) ×2 IMPLANT
YANKAUER SUCT BULB TIP NO VENT (SUCTIONS) ×4 IMPLANT

## 2016-05-12 NOTE — Op Note (Signed)
MRN:     [id number] DOB/AGE:    07-14-1945 / 71 y.o.       OPERATIVE REPORT    DATE OF PROCEDURE:  05/12/2016       PREOPERATIVE DIAGNOSIS:   Primary localized OA left knee       Estimated body mass index is 25.4 kg/m as calculated from the following:   Height as of this encounter: 5\' 9"  (1.753 m).   Weight as of this encounter: 78 kg (172 lb).                                                        POSTOPERATIVE DIAGNOSIS:   same                                                                     PROCEDURE:  Procedure(s): LEFT TOTAL KNEE ARTHROPLASTY Using Depuy Attune RP implants #5 Femur, #7Tibia, 49mm  RP bearing, 35 Patella     SURGEON: Jaylene Schrom A    ASSISTANT:  Kirstin Shepperson PA-C   (Present and scrubbed throughout the case, critical for assistance with exposure, retraction, instrumentation, and closure.)         ANESTHESIA: Spinal with Adductor Nerve Block     TOURNIQUET TIME: 83JAS   COMPLICATIONS:  None     SPECIMENS: None   INDICATIONS FOR PROCEDURE: The patient has  left knee DJD, valgus deformities, XR shows bone on bone arthritis. Patient has failed all conservative measures including anti-inflammatory medicines, narcotics, attempts at  exercise and weight loss, cortisone injections and viscosupplementation.  Risks and benefits of surgery have been discussed, questions answered.   DESCRIPTION OF PROCEDURE: The patient identified by armband, received  right femoral nerve block and IV antibiotics, in the holding area at [facility]. Patient taken to the operating room, appropriate anesthetic  monitors were attached Spinal anesthesia induced with  the patient in supine position, Foley catheter was inserted. Tourniquet  applied high to the operative thigh. Lateral post and foot positioner  applied to the table, the lower extremity was then prepped and draped  in usual sterile fashion from the ankle to the tourniquet. Time-out procedure was performed. The  limb was wrapped with an Esmarch bandage and the tourniquet inflated to 365 mmHg. We began the operation by making the anterior midline incision starting at handbreadth above the patella going over the patella 1 cm medial to and  4 cm distal to the tibial tubercle. Small bleeders in the skin and the  subcutaneous tissue identified and cauterized. Transverse retinaculum was incised and reflected medially and a medial parapatellar arthrotomy was accomplished. the patella was everted and theprepatellar fat pad resected. The superficial medial collateral  ligament was then elevated from anterior to posterior along the proximal  flare of the tibia and anterior half of the menisci resected. The knee was hyperflexed exposing bone on bone arthritis. Peripheral and notch osteophytes as well as the cruciate ligaments were then resected. We continued to  work our way around posteriorly along the proximal tibia, and externally  rotated the tibia subluxing it  out from underneath the femur. A McHale  retractor was placed through the notch and a lateral Hohmann retractor  placed, and we then drilled through the proximal tibia in line with the  axis of the tibia followed by an intramedullary guide rod and 2-degree  posterior slope cutting guide. The tibial cutting guide was pinned into place  allowing resection of 6 mm of bone medially and about 4 mm of bone  laterally because of her valgus deformity. Satisfied with the tibial resection, we then  entered the distal femur 2 mm anterior to the PCL origin with the  intramedullary guide rod and applied the distal femoral cutting guide  set at 13mm, with 5 degrees of valgus. This was pinned along the  epicondylar axis. At this point, the distal femoral cut was accomplished without difficulty. We then sized for a #5 femoral component and pinned the guide in 3 degrees of external rotation.The chamfer cutting guide was pinned into place. The anterior, posterior, and chamfer  cuts were accomplished without difficulty followed by  the  RP box cutting guide and the box cut. We also removed posterior osteophytes from the posterior femoral condyles. At this  time, the knee was brought into full extension. We checked our  extension and flexion gaps and found them symmetric at 43mm.  The patella thickness measured at 25 mm. We set the cutting guide at 15 and removed the posterior 9.5-10 mm  of the patella sized for 35 button and drilled the lollipop. The knee  was then once again hyperflexed exposing the proximal tibia. We sized for a #7 tibial base plate, applied the smokestack and the conical reamer followed by the the Delta fin keel punch. We then hammered into place the  RP trial femoral component, inserted a 1 trial bearing, trial patellar button, and took the knee through range of motion from 0-130 degrees. No thumb pressure was required for patellar  tracking. At this point, all trial components were removed, a double batch of DePuy HV cement  was mixed and applied to all bony metallic mating surfaces except for the posterior condyles of the femur itself. In order, we  hammered into place the tibial tray and removed excess cement, the femoral component and removed excess cement, a 46mm  RP bearing  was inserted, and the knee brought to full extension with compression.  The patellar button was clamped into place, and excess cement  removed. While the cement cured the wound was irrigated out with normal saline solution pulse lavage.. Ligament stability and patellar tracking were checked and found to be excellent.. The parapatellar arthrotomy was closed with  #1 Vicryl suture. The subcutaneous tissue with 0 and 2-0 undyed  Vicryl suture, and 4-0 Monocryl.. A dressing of Aquaseal,  4 x 4, dressing sponges, Webril, and Ace wrap applied. Needle and sponge count were correct times 2.The patient awakened, extubated, and taken to recovery room without difficulty. Vascular status was  normal, pulses 2+ and symmetric.   Brent Payne A 05/12/2016, 1:40 PM

## 2016-05-12 NOTE — Progress Notes (Signed)
Orthopedic Tech Progress Note Patient Details:  Brent Payne 21-Jun-1945 [id number]  CPM Left Knee Left Knee Flexion (Degrees): 90 Left Knee Extension (Degrees): 0  Ortho Devices Ortho Device/Splint Location: footsie roll Ortho Device/Splint Interventions: Ordered, Application   Braulio Bosch 05/12/2016, 4:24 PM

## 2016-05-12 NOTE — Anesthesia Procedure Notes (Signed)
Spinal  Patient location during procedure: OR Staffing Anesthesiologist: Lyndle Herrlich Preanesthetic Checklist Completed: patient identified, site marked, surgical consent, pre-op evaluation, timeout performed, IV checked, risks and benefits discussed and monitors and equipment checked Spinal Block Patient position: sitting Prep: DuraPrep Patient monitoring: heart rate, cardiac monitor, continuous pulse ox and blood pressure Approach: midline Location: L3-4 Injection technique: single-shot Needle Needle type: Sprotte  Needle gauge: 24 G Needle length: 9 cm Additional Notes Spinal Dosage in OR  .5% Bupivicaine 3 ml   + 2.8cc D10  = 5.8cc LLD x 4 min

## 2016-05-12 NOTE — Transfer of Care (Signed)
Immediate Anesthesia Transfer of Care Note  Patient: Brent Payne  Procedure(s) Performed: Procedure(s): LEFT TOTAL KNEE ARTHROPLASTY (Left)  Patient Location: PACU  Anesthesia Type:MAC and Regional  Level of Consciousness: awake, alert , oriented and sedated  Airway & Oxygen Therapy: Patient Spontanous Breathing and Patient connected to nasal cannula oxygen  Post-op Assessment: Report given to RN, Post -op Vital signs reviewed and stable and Patient moving all extremities  Post vital signs: Reviewed and stable  Last Vitals:  Vitals:   05/12/16 1010 05/12/16 1415  BP: (!) 155/95 116/67  Pulse: (!) 55 66  Resp: 18 11  Temp: 36.4 C 36.4 C    Last Pain:  Vitals:   05/12/16 1010  TempSrc: Oral      Patients Stated Pain Goal: 2 (23/76/28 3151)  Complications: No apparent anesthesia complications

## 2016-05-12 NOTE — Interval H&P Note (Signed)
History and Physical Interval Note:  05/12/2016 11:38 AM  Brent Payne  has presented today for surgery, with the diagnosis of left knee DJD  The various methods of treatment have been discussed with the patient and family. After consideration of risks, benefits and other options for treatment, the patient has consented to  Procedure(s): LEFT TOTAL KNEE ARTHROPLASTY (Left) as a surgical intervention .  The patient's history has been reviewed, patient examined, no change in status, stable for surgery.  I have reviewed the patient's chart and labs.  Questions were answered to the patient's satisfaction.     Elsie Saas A

## 2016-05-12 NOTE — Anesthesia Preprocedure Evaluation (Signed)
Anesthesia Evaluation  Patient identified by MRN, date of birth, ID band Patient awake    Reviewed: Allergy & Precautions, H&P , Patient's Chart, lab work & pertinent test results, reviewed documented beta blocker date and time   Airway Mallampati: II  TM Distance: >3 FB Neck ROM: full    Dental no notable dental hx.    Pulmonary former smoker,    Pulmonary exam normal breath sounds clear to auscultation       Cardiovascular hypertension,  Rhythm:regular Rate:Normal     Neuro/Psych    GI/Hepatic   Endo/Other    Renal/GU      Musculoskeletal   Abdominal   Peds  Hematology   Anesthesia Other Findings   Reproductive/Obstetrics                             Anesthesia Physical Anesthesia Plan  ASA: II  Anesthesia Plan: Spinal   Post-op Pain Management:    Induction:   Airway Management Planned:   Additional Equipment:   Intra-op Plan:   Post-operative Plan:   Informed Consent: I have reviewed the patients History and Physical, chart, labs and discussed the procedure including the risks, benefits and alternatives for the proposed anesthesia with the patient or authorized representative who has indicated his/her understanding and acceptance.   Dental Advisory Given  Plan Discussed with: CRNA and Surgeon  Anesthesia Plan Comments: (  )        Anesthesia Quick Evaluation

## 2016-05-12 NOTE — Anesthesia Procedure Notes (Signed)
Anesthesia Regional Block: Adductor canal block   Pre-Anesthetic Checklist: ,, timeout performed, Correct Patient, Correct Site, Correct Laterality, Correct Procedure, Correct Position, site marked, Risks and benefits discussed, pre-op evaluation,  At surgeon's request and post-op pain management  Laterality: Left  Prep: chloraprep       Needles:   Needle Type: Echogenic Needle          Additional Needles:   Procedures: ultrasound guided,,,,,,,,  Narrative:  Start time: 05/12/2016 10:42 AM End time: 05/12/2016 10:47 AM Injection made incrementally with aspirations every 5 mL. Anesthesiologist: Lyndle Herrlich

## 2016-05-13 ENCOUNTER — Encounter (HOSPITAL_COMMUNITY): Payer: Self-pay | Admitting: Orthopedic Surgery

## 2016-05-13 LAB — BASIC METABOLIC PANEL
ANION GAP: 8 (ref 5–15)
BUN: 12 mg/dL (ref 6–20)
CALCIUM: 9.1 mg/dL (ref 8.9–10.3)
CO2: 25 mmol/L (ref 22–32)
Chloride: 105 mmol/L (ref 101–111)
Creatinine, Ser: 1.42 mg/dL — ABNORMAL HIGH (ref 0.61–1.24)
GFR, EST AFRICAN AMERICAN: 55 mL/min — AB (ref >60)
GFR, EST NON AFRICAN AMERICAN: 48 mL/min — AB (ref >60)
GLUCOSE: 152 mg/dL — AB (ref 65–99)
POTASSIUM: 4.7 mmol/L (ref 3.5–5.1)
Sodium: 138 mmol/L (ref 135–145)

## 2016-05-13 LAB — CBC
HEMATOCRIT: 28.7 % — AB (ref 39.0–52.0)
Hemoglobin: 9.8 g/dL — ABNORMAL LOW (ref 13.0–17.0)
MCH: 29.8 pg (ref 26.0–34.0)
MCHC: 34.1 g/dL (ref 30.0–36.0)
MCV: 87.2 fL (ref 78.0–100.0)
Platelets: 199 10*3/uL (ref 150–400)
RBC: 3.29 MIL/uL — AB (ref 4.22–5.81)
RDW: 13.4 % (ref 11.5–15.5)
WBC: 10.1 10*3/uL (ref 4.0–10.5)

## 2016-05-13 MED ORDER — ASPIRIN 325 MG PO TBEC: DELAYED_RELEASE_TABLET | ORAL | 0 refills | Status: DC

## 2016-05-13 MED ORDER — DOCUSATE SODIUM 100 MG PO CAPS: ORAL_CAPSULE | ORAL | 0 refills | Status: DC

## 2016-05-13 MED ORDER — POLYETHYLENE GLYCOL 3350 17 G PO PACK: PACK | ORAL | 0 refills | Status: DC

## 2016-05-13 MED ORDER — OXYCODONE HCL 5 MG PO TABS
ORAL_TABLET | ORAL | 0 refills | Status: DC
Start: 2016-05-13 — End: 2019-12-28

## 2016-05-13 NOTE — Progress Notes (Signed)
Physical Therapy Treatment Patient Details Name: Brent Payne MRN: [id number] DOB: 12-25-1945 Today's Date: 05/13/2016    History of Present Illness Pt is 71 yo male with c/o L knee pain secondary to end stage L knee OA, s/p L TKA 05/12/16, PMH significant for HTN, OA and prostate disease.     PT Comments    Pt is progressing towards goals and anticipate d/c tomorrow after PT session. Pt is min guard for bed mobility, transfers and ambulation of 125 feet with RW. Pt is min A for ascend/descend of 2 steps. Pt requires skilled PT to continue to progress ambulation and to improve L knee ROM and endurance to be able to safely navigate his home environment at discharge.   Follow Up Recommendations  Home health PT;Supervision/Assistance - 24 hour     Equipment Recommendations  Rolling walker with 5" wheels;3in1 (PT)    Recommendations for Other Services OT consult     Precautions / Restrictions Precautions Precautions: Knee Precaution Booklet Issued: Yes (comment) Precaution Comments: educated on not placing pillow under knee  Required Braces or Orthoses: Knee Immobilizer - Left Restrictions Weight Bearing Restrictions: Yes LLE Weight Bearing: Weight bearing as tolerated    Mobility  Bed Mobility Overal bed mobility: Modified Independent             General bed mobility comments: uses bedrail assist   Transfers Overall transfer level: Needs assistance Equipment used: Rolling walker (2 wheeled) Transfers: Sit to/from Stand Sit to Stand: Min guard         General transfer comment: vc for push off from the recliner  Ambulation/Gait Ambulation/Gait assistance: Min guard Ambulation Distance (Feet): 125 Feet Assistive device: Rolling walker (2 wheeled) Gait Pattern/deviations: Step-to pattern;Decreased step length - right;Decreased stance time - left;Step-through pattern;Trunk flexed Gait velocity: slowed Gait velocity interpretation: Below normal speed for  age/gender General Gait Details: Pt began ambulation with step to pattern and progressed to step through pattern with no LoB, no buckling, vc for upright posture and staying inside the walker     Stairs Stairs: Yes   Stair Management: Two rails;One rail Left Number of Stairs: 2 General stair comments: Pt able to ascend/descend 2 steps with minA with 2 handrails and one handrail on the left to simulate steps to get into house.       Balance Overall balance assessment: Needs assistance Sitting-balance support: Feet supported Sitting balance-Leahy Scale: Good     Standing balance support: Single extremity supported;During functional activity Standing balance-Leahy Scale: Good Standing balance comment: able to come to static standing before reaching to the walker                            Cognition Arousal/Alertness: Awake/alert Behavior During Therapy: WFL for tasks assessed/performed Overall Cognitive Status: Within Functional Limits for tasks assessed                                        Exercises Total Joint Exercises Ankle Circles/Pumps: AROM;Both;10 reps;Seated Quad Sets: AROM;Left;10 reps;Seated Short Arc Quad: AROM;Left;10 reps;Supine Heel Slides: AROM;Left;10 reps;Supine Hip ABduction/ADduction: AROM;Left;10 reps;Supine Straight Leg Raises: AROM;Left;10 reps;Supine Long Arc Quad: AROM;Left;10 reps;Supine Knee Flexion: AROM;Left;10 reps;Supine Goniometric ROM: 12 to 78 degrees measured in the recliner        Pertinent Vitals/Pain Pain Location: L knee Pain Descriptors / Indicators: Stabbing;Constant  Home Living Family/patient expects to be discharged to:: Private residence Living Arrangements: Spouse/significant other Available Help at Discharge: Family;Available 24 hours/day Type of Home: House Home Access: Stairs to enter Entrance Stairs-Rails: Left Home Layout: One level Home Equipment: Walker - 2 wheels;Bedside  commode      Prior Function Level of Independence: Independent      Comments: community ambulator and driver   PT Goals (current goals can now be found in the care plan section) Acute Rehab PT Goals Patient Stated Goal: to go home PT Goal Formulation: With patient Time For Goal Achievement: 05/20/16 Potential to Achieve Goals: Good Progress towards PT goals: Progressing toward goals    Frequency    7X/week      PT Plan Current plan remains appropriate       End of Session Equipment Utilized During Treatment: Gait belt Activity Tolerance: Patient tolerated treatment well Patient left: with call bell/phone within reach;in bed Nurse Communication: Mobility status;Patient requests pain meds PT Visit Diagnosis: Other abnormalities of gait and mobility (R26.89);Pain Pain - Right/Left: Left Pain - part of body: Knee     Time: 1330-1405 PT Time Calculation (min) (ACUTE ONLY): 35 min  Charges:  $Gait Training: 8-22 mins $Therapeutic Exercise: 8-22 mins                    G Codes:       Ramey Schiff B. Migdalia Dk PT, DPT Acute Rehabilitation  [phone] Pager [phone]     [place] 05/13/2016, 2:19 PM

## 2016-05-13 NOTE — Evaluation (Signed)
Physical Therapy Evaluation Patient Details Name: Brent Payne MRN: [id number] DOB: 1946-01-02 Today's Date: 05/13/2016   History of Present Illness  Pt is 71 yo male with c/o L knee pain secondary to end stage L knee OA, s/p L TKA 05/12/16, PMH significant for HTN, OA and prostate disease.   Clinical Impression  Pt is s/p TKA resulting in the deficits listed below (see PT Problem List). Pt is min guard for transfers to RW and min guard for ambulation of 200 feet with RW. Pt to work on exercises and stair training at next session.  Pt will benefit from skilled PT to increase their independence and safety with mobility to allow discharge to the venue listed below.      Follow Up Recommendations Home health PT;Supervision/Assistance - 24 hour    Equipment Recommendations  Rolling walker with 5" wheels;3in1 (PT)    Recommendations for Other Services OT consult     Precautions / Restrictions Precautions Precautions: Knee Precaution Booklet Issued: Yes (comment) Precaution Comments: educated on not placing pillow under knee  Required Braces or Orthoses: Knee Immobilizer - Left Restrictions Weight Bearing Restrictions: Yes LLE Weight Bearing: Weight bearing as tolerated      Mobility  Bed Mobility               General bed mobility comments: Pt in recliner at entry  Transfers Overall transfer level: Needs assistance Equipment used: Rolling walker (2 wheeled) Transfers: Sit to/from Stand Sit to Stand: Min guard         General transfer comment: vc for push off from the recliner  Ambulation/Gait Ambulation/Gait assistance: Min guard Ambulation Distance (Feet): 200 Feet Assistive device: Rolling walker (2 wheeled) Gait Pattern/deviations: Step-to pattern;Decreased step length - right;Decreased stance time - left;Step-through pattern;Trunk flexed Gait velocity: slowed Gait velocity interpretation: Below normal speed for age/gender General Gait Details: Pt  progressed from step to to step through pattern with no LoB, no buckling, vc for upright posture and staying inside the walker           Balance Overall balance assessment: Needs assistance Sitting-balance support: Feet supported Sitting balance-Leahy Scale: Good     Standing balance support: Single extremity supported;During functional activity Standing balance-Leahy Scale: Fair Standing balance comment: requires RW assist                             Pertinent Vitals/Pain Pain Assessment: 0-10 Pain Score: 8  Pain Location: L knee Pain Descriptors / Indicators: Stabbing;Constant Pain Intervention(s): Premedicated before session;Limited activity within patient's tolerance;Ice applied  VSS    Home Living Family/patient expects to be discharged to:: Private residence Living Arrangements: Spouse/significant other Available Help at Discharge: Family;Available 24 hours/day Type of Home: House Home Access: Stairs to enter Entrance Stairs-Rails: Left Entrance Stairs-Number of Steps: 2 Home Layout: One level Home Equipment: Walker - 2 wheels;Bedside commode      Prior Function Level of Independence: Independent         Comments: community ambulator and driver     Hand Dominance   Dominant Hand: Right    Extremity/Trunk Assessment   Upper Extremity Assessment Upper Extremity Assessment: Overall WFL for tasks assessed    Lower Extremity Assessment Lower Extremity Assessment: LLE deficits/detail;Defer to PT evaluation LLE Deficits / Details: L knee and hip ROM and strength limited by surgical pain L ankle ROM WFL LLE: Unable to fully assess due to pain LLE Sensation:  (intact)  Cervical / Trunk Assessment Cervical / Trunk Assessment: Normal  Communication   Communication: No difficulties  Cognition Arousal/Alertness: Awake/alert Behavior During Therapy: WFL for tasks assessed/performed Overall Cognitive Status: Within Functional Limits for tasks  assessed                                        General Comments General comments (skin integrity, edema, etc.): PA request KI not be used with gait in next session    Exercises Total Joint Exercises Ankle Circles/Pumps: AROM;Both;10 reps;Seated Quad Sets: AROM;Left;10 reps;Seated   Assessment/Plan    PT Assessment Patient needs continued PT services  PT Problem List Decreased strength;Decreased range of motion;Decreased activity tolerance;Decreased mobility;Decreased knowledge of use of DME;Pain       PT Treatment Interventions DME instruction;Gait training;Stair training;Functional mobility training;Therapeutic activities;Therapeutic exercise;Balance training;Patient/family education    PT Goals (Current goals can be found in the Care Plan section)  Acute Rehab PT Goals Patient Stated Goal: to go home PT Goal Formulation: With patient Time For Goal Achievement: 05/20/16 Potential to Achieve Goals: Good    Frequency 7X/week    End of Session Equipment Utilized During Treatment: Gait belt;Left knee immobilizer Activity Tolerance: Patient tolerated treatment well Patient left: in chair;with call bell/phone within reach Nurse Communication: Mobility status;Patient requests pain meds PT Visit Diagnosis: Other abnormalities of gait and mobility (R26.89);Pain Pain - Right/Left: Left Pain - part of body: Knee    Time: 8185-9093 PT Time Calculation (min) (ACUTE ONLY): 26 min   Charges:   PT Evaluation $PT Eval Low Complexity: 1 Procedure PT Treatments $Gait Training: 8-22 mins   PT G Codes:        Kaniyah Lisby B. Migdalia Dk PT, DPT Acute Rehabilitation  [phone] Pager [phone]    [place] 05/13/2016, 11:06 AM

## 2016-05-13 NOTE — Progress Notes (Signed)
Occupational Therapy Evaluation Patient Details Name: Brent FRIEDEN MRN: [id number] DOB: Oct 27, 1945 Today's Date: 05/13/2016    History of Present Illness s/p L TKA   Clinical Impression   Completed all education regarding compensatory techniques for ADL, safe tub transfer technique using a 3in1, reducing risk of falls and home safety. Pt able to return demonstrate. Pt safe to DC home today from OT standpoint.     Follow Up Recommendations  No OT follow up;Supervision - Intermittent    Equipment Recommendations  3 in 1 bedside commode    Recommendations for Other Services       Precautions / Restrictions Precautions Precautions: Knee Precaution Booklet Issued: Yes (comment) Precaution Comments: educated on not placing pillow under knee  Required Braces or Orthoses: Knee Immobilizer - Left Restrictions Weight Bearing Restrictions: Yes LLE Weight Bearing: Weight bearing as tolerated      Mobility Bed Mobility               General bed mobility comments: OOB in chair  Transfers Overall transfer level: Needs assistance Equipment used: Rolling walker (2 wheeled) Transfers: Sit to/from Stand Sit to Stand: Supervision         General transfer comment: vc for hand placement    Balance                                           ADL either performed or assessed with clinical judgement   ADL      Pt requires min A for LB bathing/dressing. Wife will be able to assist. Pt able to return demonstrate safe tub transfer using 3 in 1. Educated on reducing risk of falls at home. Pt verbalized understanding.  S for toilet transfer and mod I with clothing management                                         Vision Baseline Vision/History: Wears glasses Wears Glasses: Reading only       Perception     Praxis      Pertinent Vitals/Pain Pain Assessment: 0-10 Pain Score: 8  Pain Location: L knee Pain Descriptors / Indicators:  Stabbing;Constant Pain Intervention(s): Premedicated before session;Limited activity within patient's tolerance;Ice applied     Hand Dominance Right   Extremity/Trunk Assessment Upper Extremity Assessment Upper Extremity Assessment: Overall WFL for tasks assessed   Lower Extremity Assessment Lower Extremity Assessment: LLE deficits/detail;Defer to PT evaluation LLE Deficits / Details: (P) L knee and hip ROM and strength limited by surgical pain L ankle ROM WFL LLE: (P) Unable to fully assess due to pain LLE Sensation: (P)  (intact)   Cervical / Trunk Assessment Cervical / Trunk Assessment: Normal   Communication Communication Communication: No difficulties   Cognition Arousal/Alertness: Awake/alert Behavior During Therapy: WFL for tasks assessed/performed Overall Cognitive Status: Within Functional Limits for tasks assessed                                     General Comments       Exercises     Shoulder Instructions      Home Living Family/patient expects to be discharged to:: Private residence Living Arrangements: Spouse/significant other Available Help at Discharge: Family;Available 24  hours/day Type of Home: House Home Access: Stairs to enter [employer] of Steps: 2 Entrance Stairs-Rails: Left Home Layout: One level     Bathroom Shower/Tub: Corporate investment banker: Standard Bathroom Accessibility: Yes How Accessible: Accessible via walker Home Equipment: [place] - 2 wheels;Bedside commode          Prior Functioning/Environment Level of Independence: Independent        Comments: community ambulator and driver        OT Problem List: Decreased strength;Decreased activity tolerance;Decreased knowledge of precautions;Decreased knowledge of use of DME or AE;Pain      OT Treatment/Interventions:      OT Goals(Current goals can be found in the care plan section) Acute Rehab OT Goals Patient Stated Goal: to  go home OT Goal Formulation: All assessment and education complete, DC therapy  OT Frequency:     Barriers to D/C:            Co-evaluation              End of Session Equipment Utilized During Treatment: Gait belt;Rolling walker;Left knee immobilizer CPM Left Knee CPM Left Knee: Off Nurse Communication: Mobility status  Activity Tolerance: Patient tolerated treatment well Patient left: in chair;with call bell/phone within reach;Other (comment) (in Zero knee foarm)  OT Visit Diagnosis: Unsteadiness on feet (R26.81);Pain Pain - Right/Left: Left Pain - part of body: Knee                Time: 1610-9604 OT Time Calculation (min): 25 min Charges:  OT General Charges $OT Visit: 1 Procedure OT Evaluation $OT Eval Low Complexity: 1 Procedure OT Treatments $Self Care/Home Management : 8-22 mins G-Codes:     [facility], OT/L  [phone] 05/13/2016  Gracyn Santillanes,HILLARY 05/13/2016, 10:56 AM

## 2016-05-13 NOTE — Anesthesia Postprocedure Evaluation (Signed)
Anesthesia Post Note  Patient: Brent Payne  Procedure(s) Performed: Procedure(s) (LRB): LEFT TOTAL KNEE ARTHROPLASTY (Left)  Patient location during evaluation: PACU Anesthesia Type: MAC Level of consciousness: awake Pain management: satisfactory to patient Vital Signs Assessment: post-procedure vital signs reviewed and stable Respiratory status: spontaneous breathing Cardiovascular status: blood pressure returned to baseline Postop Assessment: no headache and spinal receding Anesthetic complications: no       Last Vitals:  Vitals:   05/13/16 0014 05/13/16 0605  BP: (!) 141/66 129/69  Pulse: 73 60  Resp: 16 16  Temp: 37 C 36.6 C    Last Pain:  Vitals:   05/13/16 0926  TempSrc:   PainSc: 9                  Charda Janis EDWARD

## 2016-05-13 NOTE — Progress Notes (Signed)
Orthopedic Tech Progress Note Patient Details:  Brent Payne May 17, 1945 [id number] On cpm at 1805 0-50 Patient ID: [name], male   DOB: 05-01-1945, 71 y.o.   MRN: [id number]   Braulio Bosch 05/13/2016, 6:05 PM

## 2016-05-14 ENCOUNTER — Encounter (HOSPITAL_COMMUNITY): Payer: Self-pay | Admitting: General Practice

## 2016-05-14 LAB — BASIC METABOLIC PANEL
Anion gap: 6 (ref 5–15)
BUN: 18 mg/dL (ref 6–20)
CALCIUM: 8.9 mg/dL (ref 8.9–10.3)
CO2: 28 mmol/L (ref 22–32)
CREATININE: 1.32 mg/dL — AB (ref 0.61–1.24)
Chloride: 103 mmol/L (ref 101–111)
GFR calc non Af Amer: 52 mL/min — ABNORMAL LOW (ref >60)
Glucose, Bld: 139 mg/dL — ABNORMAL HIGH (ref 65–99)
Potassium: 3.9 mmol/L (ref 3.5–5.1)
SODIUM: 137 mmol/L (ref 135–145)

## 2016-05-14 LAB — CBC
HCT: 25 % — ABNORMAL LOW (ref 39.0–52.0)
Hemoglobin: 8.6 g/dL — ABNORMAL LOW (ref 13.0–17.0)
MCH: 30.3 pg (ref 26.0–34.0)
MCHC: 34.4 g/dL (ref 30.0–36.0)
MCV: 88 fL (ref 78.0–100.0)
Platelets: 178 10*3/uL (ref 150–400)
RBC: 2.84 MIL/uL — ABNORMAL LOW (ref 4.22–5.81)
RDW: 13.8 % (ref 11.5–15.5)
WBC: 6.7 10*3/uL (ref 4.0–10.5)

## 2016-05-14 NOTE — Progress Notes (Signed)
Subjective: 2 Days Post-Op Procedure(s) (LRB): LEFT TOTAL KNEE ARTHROPLASTY (Left) Patient reports pain as mild.  No nausea/vomiting, lightheadedness/dizziness, chest pain/sob.  Tolerating diet.  Objective: Vital signs in last 24 hours: Temp:  [98 F (36.7 C)-98.5 F (36.9 C)] 98 F (36.7 C) (04/25 0521) Pulse Rate:  [59-71] 59 (04/25 0521) Resp:  [16-18] 18 (04/25 0521) BP: (146-162)/(70-76) 146/74 (04/25 0521) SpO2:  [96 %-98 %] 98 % (04/25 0521)  Intake/Output from previous day: 04/24 0701 - 04/25 0700 In: 240 [P.O.:240] Out: -  Intake/Output this shift: No intake/output data recorded.   Recent Labs  05/13/16 0447  HGB 9.8*    Recent Labs  05/13/16 0447  WBC 10.1  RBC 3.29*  HCT 28.7*  PLT 199    Recent Labs  05/12/16 1021 05/13/16 0447  NA 139 138  K 3.8 4.7  CL 106 105  CO2 25 25  BUN 13 12  CREATININE 1.34* 1.42*  GLUCOSE 100* 152*  CALCIUM 9.4 9.1   No results for input(s): LABPT, INR in the last 72 hours.  Neurologically intact Neurovascular intact Sensation intact distally Intact pulses distally Dorsiflexion/Plantar flexion intact Incision: dressing C/D/I No cellulitis present Compartment soft  Assessment/Plan: 2 Days Post-Op Procedure(s) (LRB): LEFT TOTAL KNEE ARTHROPLASTY (Left) Advance diet Up with therapy D/C IV fluids Discharge home with home health today after second session of PT WBAT LLE ABLA-mild and stable Dry dressing change prn  Fannie Knee 05/14/2016, 7:08 AM

## 2016-05-14 NOTE — Care Management Note (Addendum)
Case Management Note  Patient Details  Name: Brent Payne MRN: [id number] Date of Birth: 09-Nov-1945  Subjective/Objective:                 Spoke with patient and wife in the room, verified equipment at home, will follow up with [facility], referral placed to [place].   Action/Plan:   Expected Discharge Date:  05/14/16               Expected Discharge Plan:  [place]  In-House Referral:     Discharge planning Services  CM Consult  Post Acute Care Choice:  Home Health, Durable Medical Equipment Choice offered to:  Patient, Spouse  DME Arranged:    DME Agency:   (RW and CPM machine delivered to house)  HH Arranged:  PT [place]:  [facility] (now [facility])  Status of Service:  Completed, signed off  If discussed at [employer] of Stay Meetings, dates discussed:    Additional Comments:  Brent Collet, RN 05/14/2016, 10:17 AM

## 2016-05-14 NOTE — Discharge Summary (Signed)
Patient ID: Brent Payne MRN: [id number] DOB/AGE: [birth year] 71 y.o.  Admit date: 05/12/2016 Discharge date: 05/14/2016  Admission Diagnoses:  Principal Problem:   Primary localized osteoarthritis of left knee Active Problems:   Hypertension   Prostate disease   Discharge Diagnoses:  Same  Past Medical History:  Diagnosis Date  . Allergy   . Hypertension   . Primary localized osteoarthritis of left knee   . Primary localized osteoarthritis of left knee   . Prostate disease     Surgeries: Procedure(s): LEFT TOTAL KNEE ARTHROPLASTY on 05/12/2016   Consultants:   Discharged Condition: Improved  Hospital Course: AKSEL BENCOMO is an 71 y.o. male who was admitted 05/12/2016 for operative treatment ofPrimary localized osteoarthritis of left knee. Patient has severe unremitting pain that affects sleep, daily activities, and work/hobbies. After pre-op clearance the patient was taken to the operating room on 05/12/2016 and underwent  Procedure(s): LEFT TOTAL KNEE ARTHROPLASTY.    Patient was given perioperative antibiotics: Anti-infectives    Start     Dose/Rate Route Frequency Ordered Stop   05/12/16 1800  ceFAZolin (ANCEF) IVPB 2g/100 mL premix     2 g 200 mL/hr over 30 Minutes Intravenous Every 6 hours 05/12/16 1747 05/12/16 2339   05/12/16 1020  ceFAZolin (ANCEF) IVPB 2g/100 mL premix     2 g 200 mL/hr over 30 Minutes Intravenous On call to O.R. 05/12/16 1020 05/12/16 1150   05/12/16 0944  ceFAZolin (ANCEF) 2-4 GM/100ML-% IVPB    Comments:  Forte, Lindsi   : cabinet override      05/12/16 0944 05/12/16 1150       Patient was given sequential compression devices, early ambulation, and chemoprophylaxis to prevent DVT.  Patient benefited maximally from hospital stay and there were no complications.    Recent vital signs: Patient Vitals for the past 24 hrs:  BP Temp Temp src Pulse Resp SpO2  05/14/16 0521 (!) 146/74 98 F (36.7 C) Oral (!) 59 18 98 %  05/13/16 2008 (!)  162/76 98.2 F (36.8 C) Oral 70 18 96 %  05/13/16 1549 (!) 151/70 98.5 F (36.9 C) Oral 71 16 98 %     Recent laboratory studies:  Recent Labs  05/12/16 1021 05/13/16 0447  WBC  --  10.1  HGB  --  9.8*  HCT  --  28.7*  PLT  --  199  NA 139 138  K 3.8 4.7  CL 106 105  CO2 25 25  BUN 13 12  CREATININE 1.34* 1.42*  GLUCOSE 100* 152*  CALCIUM 9.4 9.1     Discharge Medications:   Allergies as of 05/14/2016      Reactions   No Known Allergies       Medication List    TAKE these medications   acetaminophen 500 MG tablet Commonly known as:  TYLENOL Take 1,000 mg by mouth every 6 (six) hours as needed for mild pain.   aspirin 325 MG EC tablet 1 tab a day for the next 30 days to prevent blood clots   docusate sodium 100 MG capsule Commonly known as:  COLACE 1 tab 2 times a day while on narcotics.  STOOL SOFTENER   loratadine 10 MG tablet Commonly known as:  CLARITIN Take 10 mg by mouth daily.   losartan-hydrochlorothiazide 50-12.5 MG tablet Commonly known as:  HYZAAR Take 1 tablet by mouth every evening.   naproxen sodium 220 MG tablet Commonly known as:  ANAPROX Take 440 mg by mouth  daily as needed (headache).   oxyCODONE 5 MG immediate release tablet Commonly known as:  Oxy IR/ROXICODONE 1-2 tablets every 4-6 hrs as needed for pain   polyethylene glycol packet Commonly known as:  MIRALAX / GLYCOLAX 17grams in 6 oz of water twice a day until bowel movement.  LAXITIVE.  Restart if two days since last bowel movement   tamsulosin 0.4 MG Caps capsule Commonly known as:  FLOMAX Take 0.4 mg by mouth daily after supper.       Diagnostic Studies: Dg Chest 2 View  Result Date: 05/02/2016 CLINICAL DATA:  Preop knee replacement EXAM: CHEST  2 VIEW COMPARISON:  None. FINDINGS: Increased interstitial markings peripherally in the mid and upper lungs, likely scarring or early fibrosis. No confluent opacities. Heart is normal size. No effusions or acute bony  abnormality. IMPRESSION: Peripheral interstitial prominence in the mid and upper lung zones, likely scarring or fibrosis. Electronically Signed   By: Rolm Baptise M.D.   On: 05/02/2016 13:09    Disposition: 01-Home or Self Care  Discharge Instructions    CPM    Complete by:  As directed    Continuous passive motion machine (CPM):      Use the CPM from 0 to 90 for 6 hours per day.       You may break it up into 2 or 3 sessions per day.      Use CPM for 2 weeks or until you are told to stop.   Call MD / Call 911    Complete by:  As directed    If you experience chest pain or shortness of breath, CALL 911 and be transported to the hospital emergency room.  If you develope a fever above 101 F, pus (white drainage) or increased drainage or redness at the wound, or calf pain, call your surgeon's office.   Change dressing    Complete by:  As directed    DO NOT REMOVE BANDAGE OVER SURGICAL INCISION.  [place] WHOLE LEG INCLUDING OVER THE WATERPROOF BANDAGE WITH SOAP AND WATER EVERY DAY.   Constipation Prevention    Complete by:  As directed    Drink plenty of fluids.  Prune juice may be helpful.  You may use a stool softener, such as Colace (over the counter) 100 mg twice a day.  Use MiraLax (over the counter) for constipation as needed.   Diet - low sodium heart healthy    Complete by:  As directed    Discharge instructions    Complete by:  As directed    INSTRUCTIONS AFTER JOINT REPLACEMENT   Remove items at home which could result in a fall. This includes throw rugs or furniture in walking pathways ICE to the affected joint every three hours while awake for 30 minutes at a time, for at least the first 3-5 days, and then as needed for pain and swelling.  Continue to use ice for pain and swelling. You may notice swelling that will progress down to the foot and ankle.  This is normal after surgery.  Elevate your leg when you are not up walking on it.   Continue to use the breathing machine you got  in the hospital (incentive spirometer) which will help keep your temperature down.  It is common for your temperature to cycle up and down following surgery, especially at night when you are not up moving around and exerting yourself.  The breathing machine keeps your lungs expanded and your temperature down.  DIET:  As you were doing prior to hospitalization, we recommend a well-balanced diet.  DRESSING / WOUND CARE / SHOWERING  Keep the surgical dressing until follow up.  The dressing is water proof, so you can shower without any extra covering.  IF THE DRESSING FALLS OFF or the wound gets wet inside, change the dressing with sterile gauze.  Please use good hand washing techniques before changing the dressing.  Do not use any lotions or creams on the incision until instructed by your surgeon.    ACTIVITY  Increase activity slowly as tolerated, but follow the weight bearing instructions below.   No driving for 6 weeks or until further direction given by your physician.  You cannot drive while taking narcotics.  No lifting or carrying greater than 10 lbs. until further directed by your surgeon. Avoid periods of inactivity such as sitting longer than an hour when not asleep. This helps prevent blood clots.  You may return to work once you are authorized by your doctor.     WEIGHT BEARING   Weight bearing as tolerated with assist device (walker, cane, etc) as directed, use it as long as suggested by your surgeon or therapist, typically at least 2-3 weeks.   EXERCISES  Results after joint replacement surgery are often greatly improved when you follow the exercise, range of motion and muscle strengthening exercises prescribed by your doctor. Safety measures are also important to protect the joint from further injury. Any time any of these exercises cause you to have increased pain or swelling, decrease what you are doing until you are comfortable again and then slowly increase them. If you  have problems or questions, call your caregiver or physical therapist for advice.   Rehabilitation is important following a joint replacement. After just a few days of immobilization, the muscles of the leg can become weakened and shrink (atrophy).  These exercises are designed to build up the tone and strength of the thigh and leg muscles and to improve motion. Often times heat used for twenty to thirty minutes before working out will loosen up your tissues and help with improving the range of motion but do not use heat for the first two weeks following surgery (sometimes heat can increase post-operative swelling).   These exercises can be done on a training (exercise) mat, on the floor, on a table or on a bed. Use whatever works the best and is most comfortable for you.    Use music or television while you are exercising so that the exercises are a pleasant break in your day. This will make your life better with the exercises acting as a break in your routine that you can look forward to.   Perform all exercises about fifteen times, three times per day or as directed.  You should exercise both the operative leg and the other leg as well.   Exercises include:  Quad Sets - Tighten up the muscle on the front of the thigh (Quad) and hold for 5-10 seconds.   Straight Leg Raises - With your knee straight (if you were given a brace, keep it on), lift the leg to 60 degrees, hold for 3 seconds, and slowly lower the leg.  Perform this exercise against resistance later as your leg gets stronger.  Leg Slides: Lying on your back, slowly slide your foot toward your buttocks, bending your knee up off the floor (only go as far as is comfortable). Then slowly slide your foot back down until your  leg is flat on the floor again.  Angel Wings: Lying on your back spread your legs to the side as far apart as you can without causing discomfort.  Hamstring Strength:  Lying on your back, push your heel against the floor with  your leg straight by tightening up the muscles of your buttocks.  Repeat, but this time bend your knee to a comfortable angle, and push your heel against the floor.  You may put a pillow under the heel to make it more comfortable if necessary.   A rehabilitation program following joint replacement surgery can speed recovery and prevent re-injury in the future due to weakened muscles. Contact your doctor or a physical therapist for more information on knee rehabilitation.    CONSTIPATION  Constipation is defined medically as fewer than three stools per week and severe constipation as less than one stool per week.  Even if you have a regular bowel pattern at home, your normal regimen is likely to be disrupted due to multiple reasons following surgery.  Combination of anesthesia, postoperative narcotics, change in appetite and fluid intake all can affect your bowels.   YOU MUST use at least one of the following options; they are listed in order of increasing strength to get the job done.  They are all available over the counter, and you may need to use some, POSSIBLY even all of these options:    Drink plenty of fluids (prune juice may be helpful) and high fiber foods Colace 100 mg by mouth twice a day  Senokot for constipation as directed and as needed Dulcolax (bisacodyl), take with full glass of water  Miralax (polyethylene glycol) once or twice a day as needed.  If you have tried all these things and are unable to have a bowel movement in the first 3-4 days after surgery call either your surgeon or your primary doctor.    If you experience loose stools or diarrhea, hold the medications until you stool forms back up.  If your symptoms do not get better within 1 week or if they get worse, check with your doctor.  If you experience "the worst abdominal pain ever" or develop nausea or vomiting, please contact the office immediately for further recommendations for treatment.   ITCHING:  If you  experience itching with your medications, try taking only a single pain pill, or even half a pain pill at a time.  You can also use Benadryl over the counter for itching or also to help with sleep.   TED HOSE STOCKINGS:  Use stockings on both legs until for at least 2 weeks or as directed by physician office. They may be removed at night for sleeping.  MEDICATIONS:  See your medication summary on the "After Visit Summary" that nursing will review with you.  You may have some home medications which will be placed on hold until you complete the course of blood thinner medication.  It is important for you to complete the blood thinner medication as prescribed.  PRECAUTIONS:  If you experience chest pain or shortness of breath - call 911 immediately for transfer to the hospital emergency department.   If you develop a fever greater that 101 F, purulent drainage from wound, increased redness or drainage from wound, foul odor from the wound/dressing, or calf pain - CONTACT YOUR SURGEON.  FOLLOW-UP APPOINTMENTS:  If you do not already have a post-op appointment, please call the office for an appointment to be seen by your surgeon.  Guidelines for how soon to be seen are listed in your "After Visit Summary", but are typically between 1-4 weeks after surgery.  OTHER INSTRUCTIONS:   Knee Replacement:  Do not place pillow under knee, focus on keeping the knee straight while resting. CPM instructions: 0-90 degrees, 2 hours in the morning, 2 hours in the afternoon, and 2 hours in the evening. Place foam block, curve side up under heel at all times except when in CPM or when walking.  DO NOT modify, tear, cut, or change the foam block in any way.  MAKE SURE YOU:  Understand these instructions.  Get help right away if you are not doing well or get worse.    Thank you for letting us be a part of your medical care team.  It is a privilege we respect greatly.  We  hope these instructions will help you stay on track for a fast and full recovery!   Do not put a pillow under the knee. Place it under the heel.    Complete by:  As directed    Place gray foam block, curve side up under heel at all times except when in CPM or when walking.  DO NOT modify, tear, cut, or change in any way the gray foam block.   Increase activity slowly as tolerated    Complete by:  As directed    Patient may shower    Complete by:  As directed    Aquacel dressing is water proof    Wash over it and the whole leg with soap and water at the end of your shower   TED hose    Complete by:  As directed    Use stockings (TED hose) for 2 weeks on both leg(s).  You may remove them at night for sleeping.      Follow-up Information    Lorn Junes, MD Follow up on 05/27/2016.   Specialty:  Orthopedic Surgery Why:  appointment time 2:30 pm Contact information: [address] [address] [phone]        [facility] Follow up on 05/27/2016.   Why:  appt time 1 pm for physical therapy  arrive at 12:45  to do paperwork Contact information: [address], [place] [address] [phone]           Signed: Fannie Knee 05/14/2016, 7:09 AM

## 2016-05-14 NOTE — Progress Notes (Signed)
Discharge instructions (including medications) discussed with and copy provided to patient/caregiver 

## 2016-05-14 NOTE — Progress Notes (Signed)
Physical Therapy Treatment Patient Details Name: Brent Payne MRN: [id number] DOB: 1946/01/14 Today's Date: 05/14/2016    History of Present Illness Pt is 71 yo male with c/o L knee pain secondary to end stage L knee OA, s/p L TKA 05/12/16, PMH significant for HTN, OA and prostate disease.     PT Comments    Pt is progressing towards his goals and will be ready for discharge this afternoon. Pt is supervision for bed mobility and transfers and min guard for ambulation of 510 feet with RW. Pt requires skilled PT to progress ambulation and to improve LE strength and ROM to be able to safely navigate his home environment at discharge.    Follow Up Recommendations  Home health PT;Supervision/Assistance - 24 hour     Equipment Recommendations  Rolling walker with 5" wheels;3in1 (PT)    Recommendations for Other Services OT consult     Precautions / Restrictions Precautions Precautions: Knee Precaution Booklet Issued: Yes (comment) Precaution Comments: educated on not placing pillow under knee  Restrictions Weight Bearing Restrictions: Yes LLE Weight Bearing: Weight bearing as tolerated    Mobility  Bed Mobility Overal bed mobility: Modified Independent             General bed mobility comments: uses bedrail assist   Transfers Overall transfer level: Needs assistance Equipment used: Rolling walker (2 wheeled) Transfers: Sit to/from Stand Sit to Stand: Supervision         General transfer comment: Pt with fluid ascent to upright at RW  Ambulation/Gait Ambulation/Gait assistance: Min guard Ambulation Distance (Feet): 510 Feet Assistive device: Rolling walker (2 wheeled) Gait Pattern/deviations: Decreased step length - right;Decreased stance time - left;Step-through pattern;Trunk flexed Gait velocity: slowed Gait velocity interpretation: Below normal speed for age/gender General Gait Details: vc for heel strike to stretch hamstrings and achilles           Balance Overall balance assessment: Needs assistance Sitting-balance support: Feet supported Sitting balance-Leahy Scale: Good     Standing balance support: Single extremity supported;During functional activity Standing balance-Leahy Scale: Good Standing balance comment: able to come to static standing before reaching to the walker                            Cognition Arousal/Alertness: Awake/alert Behavior During Therapy: WFL for tasks assessed/performed Overall Cognitive Status: Within Functional Limits for tasks assessed                                        Exercises Total Joint Exercises Quad Sets: AROM;Left;10 reps;Seated Long Arc Quad: AROM;Left;10 reps;Supine Knee Flexion: AROM;Left;10 reps;Supine Goniometric ROM: 10 to 72 degrees        Pertinent Vitals/Pain Pain Assessment: 0-10 Pain Score: 8  Pain Location: L knee Pain Descriptors / Indicators: Stabbing;Constant Pain Intervention(s): Limited activity within patient's tolerance;Monitored during session;Patient requesting pain meds-RN notified  VSS           PT Goals (current goals can now be found in the care plan section) Acute Rehab PT Goals Patient Stated Goal: to go home PT Goal Formulation: With patient Time For Goal Achievement: 05/20/16 Potential to Achieve Goals: Good Progress towards PT goals: Progressing toward goals    Frequency    7X/week      PT Plan Current plan remains appropriate       End of Session  Equipment Utilized During Treatment: Gait belt Activity Tolerance: Patient tolerated treatment well Patient left: with call bell/phone within reach;in bed Nurse Communication: Mobility status;Patient requests pain meds PT Visit Diagnosis: Other abnormalities of gait and mobility (R26.89);Pain Pain - Right/Left: Left Pain - part of body: Knee     Time: 5672-0919 PT Time Calculation (min) (ACUTE ONLY): 37 min  Charges:  $Gait Training: 8-22  mins $Therapeutic Exercise: 8-22 mins                    G Codes:       Amybeth Sieg B. Migdalia Dk PT, DPT Acute Rehabilitation  [phone] Pager [phone]     [place] 05/14/2016, 9:13 AM

## 2016-05-15 DIAGNOSIS — Z7982 Long term (current) use of aspirin: Secondary | ICD-10-CM | POA: Diagnosis not present

## 2016-05-15 DIAGNOSIS — Z87891 Personal history of nicotine dependence: Secondary | ICD-10-CM | POA: Diagnosis not present

## 2016-05-15 DIAGNOSIS — Z79891 Long term (current) use of opiate analgesic: Secondary | ICD-10-CM | POA: Diagnosis not present

## 2016-05-15 DIAGNOSIS — Z471 Aftercare following joint replacement surgery: Secondary | ICD-10-CM | POA: Diagnosis not present

## 2016-05-15 DIAGNOSIS — I10 Essential (primary) hypertension: Secondary | ICD-10-CM | POA: Diagnosis not present

## 2016-05-15 DIAGNOSIS — Z791 Long term (current) use of non-steroidal anti-inflammatories (NSAID): Secondary | ICD-10-CM | POA: Diagnosis not present

## 2016-05-15 DIAGNOSIS — Z96652 Presence of left artificial knee joint: Secondary | ICD-10-CM | POA: Diagnosis not present

## 2016-05-15 DIAGNOSIS — N429 Disorder of prostate, unspecified: Secondary | ICD-10-CM | POA: Diagnosis not present

## 2016-05-16 DIAGNOSIS — I10 Essential (primary) hypertension: Secondary | ICD-10-CM | POA: Diagnosis not present

## 2016-05-16 DIAGNOSIS — Z79891 Long term (current) use of opiate analgesic: Secondary | ICD-10-CM | POA: Diagnosis not present

## 2016-05-16 DIAGNOSIS — Z7982 Long term (current) use of aspirin: Secondary | ICD-10-CM | POA: Diagnosis not present

## 2016-05-16 DIAGNOSIS — N429 Disorder of prostate, unspecified: Secondary | ICD-10-CM | POA: Diagnosis not present

## 2016-05-16 DIAGNOSIS — Z87891 Personal history of nicotine dependence: Secondary | ICD-10-CM | POA: Diagnosis not present

## 2016-05-16 DIAGNOSIS — Z96652 Presence of left artificial knee joint: Secondary | ICD-10-CM | POA: Diagnosis not present

## 2016-05-16 DIAGNOSIS — Z791 Long term (current) use of non-steroidal anti-inflammatories (NSAID): Secondary | ICD-10-CM | POA: Diagnosis not present

## 2016-05-16 DIAGNOSIS — Z471 Aftercare following joint replacement surgery: Secondary | ICD-10-CM | POA: Diagnosis not present

## 2016-05-20 DIAGNOSIS — Z79891 Long term (current) use of opiate analgesic: Secondary | ICD-10-CM | POA: Diagnosis not present

## 2016-05-20 DIAGNOSIS — I10 Essential (primary) hypertension: Secondary | ICD-10-CM | POA: Diagnosis not present

## 2016-05-20 DIAGNOSIS — Z96652 Presence of left artificial knee joint: Secondary | ICD-10-CM | POA: Diagnosis not present

## 2016-05-20 DIAGNOSIS — N429 Disorder of prostate, unspecified: Secondary | ICD-10-CM | POA: Diagnosis not present

## 2016-05-20 DIAGNOSIS — Z87891 Personal history of nicotine dependence: Secondary | ICD-10-CM | POA: Diagnosis not present

## 2016-05-20 DIAGNOSIS — Z7982 Long term (current) use of aspirin: Secondary | ICD-10-CM | POA: Diagnosis not present

## 2016-05-20 DIAGNOSIS — Z791 Long term (current) use of non-steroidal anti-inflammatories (NSAID): Secondary | ICD-10-CM | POA: Diagnosis not present

## 2016-05-20 DIAGNOSIS — Z471 Aftercare following joint replacement surgery: Secondary | ICD-10-CM | POA: Diagnosis not present

## 2016-05-22 DIAGNOSIS — Z791 Long term (current) use of non-steroidal anti-inflammatories (NSAID): Secondary | ICD-10-CM | POA: Diagnosis not present

## 2016-05-22 DIAGNOSIS — Z79891 Long term (current) use of opiate analgesic: Secondary | ICD-10-CM | POA: Diagnosis not present

## 2016-05-22 DIAGNOSIS — N429 Disorder of prostate, unspecified: Secondary | ICD-10-CM | POA: Diagnosis not present

## 2016-05-22 DIAGNOSIS — Z96652 Presence of left artificial knee joint: Secondary | ICD-10-CM | POA: Diagnosis not present

## 2016-05-22 DIAGNOSIS — I10 Essential (primary) hypertension: Secondary | ICD-10-CM | POA: Diagnosis not present

## 2016-05-22 DIAGNOSIS — Z471 Aftercare following joint replacement surgery: Secondary | ICD-10-CM | POA: Diagnosis not present

## 2016-05-22 DIAGNOSIS — Z7982 Long term (current) use of aspirin: Secondary | ICD-10-CM | POA: Diagnosis not present

## 2016-05-22 DIAGNOSIS — Z87891 Personal history of nicotine dependence: Secondary | ICD-10-CM | POA: Diagnosis not present

## 2016-05-23 DIAGNOSIS — Z87891 Personal history of nicotine dependence: Secondary | ICD-10-CM | POA: Diagnosis not present

## 2016-05-23 DIAGNOSIS — Z96652 Presence of left artificial knee joint: Secondary | ICD-10-CM | POA: Diagnosis not present

## 2016-05-23 DIAGNOSIS — Z791 Long term (current) use of non-steroidal anti-inflammatories (NSAID): Secondary | ICD-10-CM | POA: Diagnosis not present

## 2016-05-23 DIAGNOSIS — Z471 Aftercare following joint replacement surgery: Secondary | ICD-10-CM | POA: Diagnosis not present

## 2016-05-23 DIAGNOSIS — Z7982 Long term (current) use of aspirin: Secondary | ICD-10-CM | POA: Diagnosis not present

## 2016-05-23 DIAGNOSIS — Z79891 Long term (current) use of opiate analgesic: Secondary | ICD-10-CM | POA: Diagnosis not present

## 2016-05-23 DIAGNOSIS — N429 Disorder of prostate, unspecified: Secondary | ICD-10-CM | POA: Diagnosis not present

## 2016-05-23 DIAGNOSIS — I10 Essential (primary) hypertension: Secondary | ICD-10-CM | POA: Diagnosis not present

## 2016-05-26 DIAGNOSIS — I10 Essential (primary) hypertension: Secondary | ICD-10-CM | POA: Diagnosis not present

## 2016-05-26 DIAGNOSIS — Z79891 Long term (current) use of opiate analgesic: Secondary | ICD-10-CM | POA: Diagnosis not present

## 2016-05-26 DIAGNOSIS — Z471 Aftercare following joint replacement surgery: Secondary | ICD-10-CM | POA: Diagnosis not present

## 2016-05-26 DIAGNOSIS — N429 Disorder of prostate, unspecified: Secondary | ICD-10-CM | POA: Diagnosis not present

## 2016-05-26 DIAGNOSIS — Z96652 Presence of left artificial knee joint: Secondary | ICD-10-CM | POA: Diagnosis not present

## 2016-05-26 DIAGNOSIS — Z7982 Long term (current) use of aspirin: Secondary | ICD-10-CM | POA: Diagnosis not present

## 2016-05-26 DIAGNOSIS — Z791 Long term (current) use of non-steroidal anti-inflammatories (NSAID): Secondary | ICD-10-CM | POA: Diagnosis not present

## 2016-05-26 DIAGNOSIS — Z87891 Personal history of nicotine dependence: Secondary | ICD-10-CM | POA: Diagnosis not present

## 2016-05-27 DIAGNOSIS — M25562 Pain in left knee: Secondary | ICD-10-CM | POA: Diagnosis not present

## 2016-05-27 DIAGNOSIS — M25462 Effusion, left knee: Secondary | ICD-10-CM | POA: Diagnosis not present

## 2016-05-27 DIAGNOSIS — R2681 Unsteadiness on feet: Secondary | ICD-10-CM | POA: Diagnosis not present

## 2016-05-27 DIAGNOSIS — M25662 Stiffness of left knee, not elsewhere classified: Secondary | ICD-10-CM | POA: Diagnosis not present

## 2016-05-27 DIAGNOSIS — M1712 Unilateral primary osteoarthritis, left knee: Secondary | ICD-10-CM | POA: Diagnosis not present

## 2016-05-28 DIAGNOSIS — R2681 Unsteadiness on feet: Secondary | ICD-10-CM | POA: Diagnosis not present

## 2016-05-28 DIAGNOSIS — M25662 Stiffness of left knee, not elsewhere classified: Secondary | ICD-10-CM | POA: Diagnosis not present

## 2016-05-28 DIAGNOSIS — M25562 Pain in left knee: Secondary | ICD-10-CM | POA: Diagnosis not present

## 2016-05-28 DIAGNOSIS — M25462 Effusion, left knee: Secondary | ICD-10-CM | POA: Diagnosis not present

## 2016-06-03 DIAGNOSIS — M25462 Effusion, left knee: Secondary | ICD-10-CM | POA: Diagnosis not present

## 2016-06-03 DIAGNOSIS — R2681 Unsteadiness on feet: Secondary | ICD-10-CM | POA: Diagnosis not present

## 2016-06-03 DIAGNOSIS — M25662 Stiffness of left knee, not elsewhere classified: Secondary | ICD-10-CM | POA: Diagnosis not present

## 2016-06-03 DIAGNOSIS — M25562 Pain in left knee: Secondary | ICD-10-CM | POA: Diagnosis not present

## 2016-06-05 DIAGNOSIS — R2681 Unsteadiness on feet: Secondary | ICD-10-CM | POA: Diagnosis not present

## 2016-06-05 DIAGNOSIS — M25462 Effusion, left knee: Secondary | ICD-10-CM | POA: Diagnosis not present

## 2016-06-05 DIAGNOSIS — M25662 Stiffness of left knee, not elsewhere classified: Secondary | ICD-10-CM | POA: Diagnosis not present

## 2016-06-05 DIAGNOSIS — M25562 Pain in left knee: Secondary | ICD-10-CM | POA: Diagnosis not present

## 2016-06-10 DIAGNOSIS — M25562 Pain in left knee: Secondary | ICD-10-CM | POA: Diagnosis not present

## 2016-06-10 DIAGNOSIS — M25662 Stiffness of left knee, not elsewhere classified: Secondary | ICD-10-CM | POA: Diagnosis not present

## 2016-06-10 DIAGNOSIS — R2681 Unsteadiness on feet: Secondary | ICD-10-CM | POA: Diagnosis not present

## 2016-06-10 DIAGNOSIS — M25462 Effusion, left knee: Secondary | ICD-10-CM | POA: Diagnosis not present

## 2016-06-13 DIAGNOSIS — M25462 Effusion, left knee: Secondary | ICD-10-CM | POA: Diagnosis not present

## 2016-06-13 DIAGNOSIS — M25562 Pain in left knee: Secondary | ICD-10-CM | POA: Diagnosis not present

## 2016-06-13 DIAGNOSIS — M25662 Stiffness of left knee, not elsewhere classified: Secondary | ICD-10-CM | POA: Diagnosis not present

## 2016-06-13 DIAGNOSIS — R2681 Unsteadiness on feet: Secondary | ICD-10-CM | POA: Diagnosis not present

## 2016-06-17 DIAGNOSIS — R2681 Unsteadiness on feet: Secondary | ICD-10-CM | POA: Diagnosis not present

## 2016-06-17 DIAGNOSIS — M1712 Unilateral primary osteoarthritis, left knee: Secondary | ICD-10-CM | POA: Diagnosis not present

## 2016-06-17 DIAGNOSIS — M25562 Pain in left knee: Secondary | ICD-10-CM | POA: Diagnosis not present

## 2016-06-17 DIAGNOSIS — M25662 Stiffness of left knee, not elsewhere classified: Secondary | ICD-10-CM | POA: Diagnosis not present

## 2016-06-17 DIAGNOSIS — M25462 Effusion, left knee: Secondary | ICD-10-CM | POA: Diagnosis not present

## 2016-06-19 DIAGNOSIS — M25662 Stiffness of left knee, not elsewhere classified: Secondary | ICD-10-CM | POA: Diagnosis not present

## 2016-06-19 DIAGNOSIS — R2681 Unsteadiness on feet: Secondary | ICD-10-CM | POA: Diagnosis not present

## 2016-06-19 DIAGNOSIS — M25462 Effusion, left knee: Secondary | ICD-10-CM | POA: Diagnosis not present

## 2016-06-19 DIAGNOSIS — M25562 Pain in left knee: Secondary | ICD-10-CM | POA: Diagnosis not present

## 2016-06-24 DIAGNOSIS — B999 Unspecified infectious disease: Secondary | ICD-10-CM | POA: Diagnosis not present

## 2016-06-30 DIAGNOSIS — M25562 Pain in left knee: Secondary | ICD-10-CM | POA: Diagnosis not present

## 2016-06-30 DIAGNOSIS — R2681 Unsteadiness on feet: Secondary | ICD-10-CM | POA: Diagnosis not present

## 2016-06-30 DIAGNOSIS — M25662 Stiffness of left knee, not elsewhere classified: Secondary | ICD-10-CM | POA: Diagnosis not present

## 2016-06-30 DIAGNOSIS — M25462 Effusion, left knee: Secondary | ICD-10-CM | POA: Diagnosis not present

## 2016-07-01 DIAGNOSIS — M25562 Pain in left knee: Secondary | ICD-10-CM | POA: Diagnosis not present

## 2016-07-01 DIAGNOSIS — M25462 Effusion, left knee: Secondary | ICD-10-CM | POA: Diagnosis not present

## 2016-07-01 DIAGNOSIS — R2681 Unsteadiness on feet: Secondary | ICD-10-CM | POA: Diagnosis not present

## 2016-07-01 DIAGNOSIS — M25662 Stiffness of left knee, not elsewhere classified: Secondary | ICD-10-CM | POA: Diagnosis not present

## 2016-07-09 DIAGNOSIS — R2681 Unsteadiness on feet: Secondary | ICD-10-CM | POA: Diagnosis not present

## 2016-07-09 DIAGNOSIS — M25462 Effusion, left knee: Secondary | ICD-10-CM | POA: Diagnosis not present

## 2016-07-09 DIAGNOSIS — M25662 Stiffness of left knee, not elsewhere classified: Secondary | ICD-10-CM | POA: Diagnosis not present

## 2016-07-09 DIAGNOSIS — M25562 Pain in left knee: Secondary | ICD-10-CM | POA: Diagnosis not present

## 2016-07-15 DIAGNOSIS — M1712 Unilateral primary osteoarthritis, left knee: Secondary | ICD-10-CM | POA: Diagnosis not present

## 2016-07-15 DIAGNOSIS — M2342 Loose body in knee, left knee: Secondary | ICD-10-CM | POA: Diagnosis not present

## 2016-07-16 DIAGNOSIS — M25662 Stiffness of left knee, not elsewhere classified: Secondary | ICD-10-CM | POA: Diagnosis not present

## 2016-07-16 DIAGNOSIS — M25462 Effusion, left knee: Secondary | ICD-10-CM | POA: Diagnosis not present

## 2016-07-16 DIAGNOSIS — R2681 Unsteadiness on feet: Secondary | ICD-10-CM | POA: Diagnosis not present

## 2016-07-16 DIAGNOSIS — M25562 Pain in left knee: Secondary | ICD-10-CM | POA: Diagnosis not present

## 2016-07-21 DIAGNOSIS — M25462 Effusion, left knee: Secondary | ICD-10-CM | POA: Diagnosis not present

## 2016-07-21 DIAGNOSIS — R2681 Unsteadiness on feet: Secondary | ICD-10-CM | POA: Diagnosis not present

## 2016-07-21 DIAGNOSIS — M25562 Pain in left knee: Secondary | ICD-10-CM | POA: Diagnosis not present

## 2016-07-21 DIAGNOSIS — M25662 Stiffness of left knee, not elsewhere classified: Secondary | ICD-10-CM | POA: Diagnosis not present

## 2016-07-28 DIAGNOSIS — Z96652 Presence of left artificial knee joint: Secondary | ICD-10-CM | POA: Diagnosis not present

## 2016-07-28 DIAGNOSIS — M25462 Effusion, left knee: Secondary | ICD-10-CM | POA: Diagnosis not present

## 2016-07-28 DIAGNOSIS — Z7982 Long term (current) use of aspirin: Secondary | ICD-10-CM | POA: Diagnosis not present

## 2016-07-28 DIAGNOSIS — Z471 Aftercare following joint replacement surgery: Secondary | ICD-10-CM | POA: Diagnosis not present

## 2016-07-28 DIAGNOSIS — R001 Bradycardia, unspecified: Secondary | ICD-10-CM | POA: Diagnosis not present

## 2016-07-28 DIAGNOSIS — H6121 Impacted cerumen, right ear: Secondary | ICD-10-CM | POA: Diagnosis not present

## 2016-07-28 DIAGNOSIS — R2681 Unsteadiness on feet: Secondary | ICD-10-CM | POA: Diagnosis not present

## 2016-07-28 DIAGNOSIS — N400 Enlarged prostate without lower urinary tract symptoms: Secondary | ICD-10-CM | POA: Diagnosis not present

## 2016-07-28 DIAGNOSIS — Z6826 Body mass index (BMI) 26.0-26.9, adult: Secondary | ICD-10-CM | POA: Diagnosis not present

## 2016-07-28 DIAGNOSIS — I10 Essential (primary) hypertension: Secondary | ICD-10-CM | POA: Diagnosis not present

## 2016-07-28 DIAGNOSIS — M25662 Stiffness of left knee, not elsewhere classified: Secondary | ICD-10-CM | POA: Diagnosis not present

## 2016-07-28 DIAGNOSIS — M25562 Pain in left knee: Secondary | ICD-10-CM | POA: Diagnosis not present

## 2016-07-28 DIAGNOSIS — Z0000 Encounter for general adult medical examination without abnormal findings: Secondary | ICD-10-CM | POA: Diagnosis not present

## 2016-07-28 DIAGNOSIS — K08409 Partial loss of teeth, unspecified cause, unspecified class: Secondary | ICD-10-CM | POA: Diagnosis not present

## 2016-07-29 DIAGNOSIS — I10 Essential (primary) hypertension: Secondary | ICD-10-CM | POA: Diagnosis not present

## 2016-07-29 DIAGNOSIS — M179 Osteoarthritis of knee, unspecified: Secondary | ICD-10-CM | POA: Diagnosis not present

## 2016-07-29 DIAGNOSIS — Z9889 Other specified postprocedural states: Secondary | ICD-10-CM | POA: Diagnosis not present

## 2016-07-31 DIAGNOSIS — M25662 Stiffness of left knee, not elsewhere classified: Secondary | ICD-10-CM | POA: Diagnosis not present

## 2016-07-31 DIAGNOSIS — M25462 Effusion, left knee: Secondary | ICD-10-CM | POA: Diagnosis not present

## 2016-07-31 DIAGNOSIS — M25562 Pain in left knee: Secondary | ICD-10-CM | POA: Diagnosis not present

## 2016-07-31 DIAGNOSIS — R2681 Unsteadiness on feet: Secondary | ICD-10-CM | POA: Diagnosis not present

## 2016-08-01 ENCOUNTER — Encounter (HOSPITAL_COMMUNITY): Payer: Self-pay | Admitting: Orthopedic Surgery

## 2016-08-01 NOTE — Anesthesia Postprocedure Evaluation (Signed)
Anesthesia Post Note  Patient: Brent Payne  Procedure(s) Performed: Procedure(s) (LRB): LEFT TOTAL KNEE ARTHROPLASTY (Left)     Anesthesia Post Evaluation  Last Vitals:  Vitals:   05/13/16 2008 05/14/16 0521  BP: (!) 162/76 (!) 146/74  Pulse: 70 (!) 59  Resp: 18 18  Temp: 36.8 C 36.7 C    Last Pain:  Vitals:   05/14/16 0900  TempSrc:   PainSc: 10-Worst pain ever                 [employer]

## 2016-08-01 NOTE — Addendum Note (Signed)
Addendum  created 08/01/16 1135 by Lyndle Herrlich, MD   Sign clinical note

## 2016-08-04 DIAGNOSIS — M25562 Pain in left knee: Secondary | ICD-10-CM | POA: Diagnosis not present

## 2016-08-04 DIAGNOSIS — R2681 Unsteadiness on feet: Secondary | ICD-10-CM | POA: Diagnosis not present

## 2016-08-04 DIAGNOSIS — M25662 Stiffness of left knee, not elsewhere classified: Secondary | ICD-10-CM | POA: Diagnosis not present

## 2016-08-04 DIAGNOSIS — M25462 Effusion, left knee: Secondary | ICD-10-CM | POA: Diagnosis not present

## 2016-08-08 DIAGNOSIS — R2681 Unsteadiness on feet: Secondary | ICD-10-CM | POA: Diagnosis not present

## 2016-08-08 DIAGNOSIS — M25662 Stiffness of left knee, not elsewhere classified: Secondary | ICD-10-CM | POA: Diagnosis not present

## 2016-08-08 DIAGNOSIS — M25462 Effusion, left knee: Secondary | ICD-10-CM | POA: Diagnosis not present

## 2016-08-08 DIAGNOSIS — M25562 Pain in left knee: Secondary | ICD-10-CM | POA: Diagnosis not present

## 2016-08-12 DIAGNOSIS — M1712 Unilateral primary osteoarthritis, left knee: Secondary | ICD-10-CM | POA: Diagnosis not present

## 2016-08-12 DIAGNOSIS — M2342 Loose body in knee, left knee: Secondary | ICD-10-CM | POA: Diagnosis not present

## 2016-08-13 DIAGNOSIS — M25662 Stiffness of left knee, not elsewhere classified: Secondary | ICD-10-CM | POA: Diagnosis not present

## 2016-08-13 DIAGNOSIS — M25562 Pain in left knee: Secondary | ICD-10-CM | POA: Diagnosis not present

## 2016-08-13 DIAGNOSIS — M25462 Effusion, left knee: Secondary | ICD-10-CM | POA: Diagnosis not present

## 2016-08-13 DIAGNOSIS — R2681 Unsteadiness on feet: Secondary | ICD-10-CM | POA: Diagnosis not present

## 2016-08-26 DIAGNOSIS — M25562 Pain in left knee: Secondary | ICD-10-CM | POA: Diagnosis not present

## 2016-08-26 DIAGNOSIS — M25662 Stiffness of left knee, not elsewhere classified: Secondary | ICD-10-CM | POA: Diagnosis not present

## 2016-08-26 DIAGNOSIS — M25462 Effusion, left knee: Secondary | ICD-10-CM | POA: Diagnosis not present

## 2016-08-26 DIAGNOSIS — R2681 Unsteadiness on feet: Secondary | ICD-10-CM | POA: Diagnosis not present

## 2016-09-02 DIAGNOSIS — M25462 Effusion, left knee: Secondary | ICD-10-CM | POA: Diagnosis not present

## 2016-09-02 DIAGNOSIS — M25662 Stiffness of left knee, not elsewhere classified: Secondary | ICD-10-CM | POA: Diagnosis not present

## 2016-09-02 DIAGNOSIS — M25562 Pain in left knee: Secondary | ICD-10-CM | POA: Diagnosis not present

## 2016-09-02 DIAGNOSIS — R2681 Unsteadiness on feet: Secondary | ICD-10-CM | POA: Diagnosis not present

## 2016-09-09 DIAGNOSIS — M2342 Loose body in knee, left knee: Secondary | ICD-10-CM | POA: Diagnosis not present

## 2016-09-09 DIAGNOSIS — M1712 Unilateral primary osteoarthritis, left knee: Secondary | ICD-10-CM | POA: Diagnosis not present

## 2016-10-29 DIAGNOSIS — H269 Unspecified cataract: Secondary | ICD-10-CM | POA: Diagnosis not present

## 2016-10-29 DIAGNOSIS — Z0001 Encounter for general adult medical examination with abnormal findings: Secondary | ICD-10-CM | POA: Diagnosis not present

## 2016-10-29 DIAGNOSIS — C61 Malignant neoplasm of prostate: Secondary | ICD-10-CM | POA: Diagnosis not present

## 2016-10-29 DIAGNOSIS — E785 Hyperlipidemia, unspecified: Secondary | ICD-10-CM | POA: Diagnosis not present

## 2016-10-29 DIAGNOSIS — Z0000 Encounter for general adult medical examination without abnormal findings: Secondary | ICD-10-CM | POA: Diagnosis not present

## 2016-10-29 DIAGNOSIS — M179 Osteoarthritis of knee, unspecified: Secondary | ICD-10-CM | POA: Diagnosis not present

## 2016-10-29 DIAGNOSIS — I10 Essential (primary) hypertension: Secondary | ICD-10-CM | POA: Diagnosis not present

## 2016-10-29 DIAGNOSIS — Z1389 Encounter for screening for other disorder: Secondary | ICD-10-CM | POA: Diagnosis not present

## 2016-10-29 DIAGNOSIS — Z23 Encounter for immunization: Secondary | ICD-10-CM | POA: Diagnosis not present

## 2016-10-29 DIAGNOSIS — N400 Enlarged prostate without lower urinary tract symptoms: Secondary | ICD-10-CM | POA: Diagnosis not present

## 2016-12-09 DIAGNOSIS — M1712 Unilateral primary osteoarthritis, left knee: Secondary | ICD-10-CM | POA: Diagnosis not present

## 2016-12-09 DIAGNOSIS — M2342 Loose body in knee, left knee: Secondary | ICD-10-CM | POA: Diagnosis not present

## 2017-01-01 DIAGNOSIS — J209 Acute bronchitis, unspecified: Secondary | ICD-10-CM | POA: Diagnosis not present

## 2017-01-29 DIAGNOSIS — N400 Enlarged prostate without lower urinary tract symptoms: Secondary | ICD-10-CM | POA: Diagnosis not present

## 2017-01-29 DIAGNOSIS — J309 Allergic rhinitis, unspecified: Secondary | ICD-10-CM | POA: Diagnosis not present

## 2017-01-29 DIAGNOSIS — M179 Osteoarthritis of knee, unspecified: Secondary | ICD-10-CM | POA: Diagnosis not present

## 2017-01-29 DIAGNOSIS — I10 Essential (primary) hypertension: Secondary | ICD-10-CM | POA: Diagnosis not present

## 2017-04-30 DIAGNOSIS — N400 Enlarged prostate without lower urinary tract symptoms: Secondary | ICD-10-CM | POA: Diagnosis not present

## 2017-04-30 DIAGNOSIS — M179 Osteoarthritis of knee, unspecified: Secondary | ICD-10-CM | POA: Diagnosis not present

## 2017-04-30 DIAGNOSIS — J309 Allergic rhinitis, unspecified: Secondary | ICD-10-CM | POA: Diagnosis not present

## 2017-04-30 DIAGNOSIS — Z0000 Encounter for general adult medical examination without abnormal findings: Secondary | ICD-10-CM | POA: Diagnosis not present

## 2017-05-12 DIAGNOSIS — M2342 Loose body in knee, left knee: Secondary | ICD-10-CM | POA: Diagnosis not present

## 2017-05-12 DIAGNOSIS — M1712 Unilateral primary osteoarthritis, left knee: Secondary | ICD-10-CM | POA: Diagnosis not present

## 2017-06-09 DIAGNOSIS — M720 Palmar fascial fibromatosis [Dupuytren]: Secondary | ICD-10-CM | POA: Diagnosis not present

## 2017-06-12 DIAGNOSIS — J302 Other seasonal allergic rhinitis: Secondary | ICD-10-CM | POA: Diagnosis not present

## 2017-06-12 DIAGNOSIS — N400 Enlarged prostate without lower urinary tract symptoms: Secondary | ICD-10-CM | POA: Diagnosis not present

## 2017-06-12 DIAGNOSIS — Z809 Family history of malignant neoplasm, unspecified: Secondary | ICD-10-CM | POA: Diagnosis not present

## 2017-06-12 DIAGNOSIS — R69 Illness, unspecified: Secondary | ICD-10-CM | POA: Diagnosis not present

## 2017-06-12 DIAGNOSIS — I10 Essential (primary) hypertension: Secondary | ICD-10-CM | POA: Diagnosis not present

## 2017-06-12 DIAGNOSIS — I951 Orthostatic hypotension: Secondary | ICD-10-CM | POA: Diagnosis not present

## 2017-06-12 DIAGNOSIS — Z87891 Personal history of nicotine dependence: Secondary | ICD-10-CM | POA: Diagnosis not present

## 2017-06-12 DIAGNOSIS — Z8249 Family history of ischemic heart disease and other diseases of the circulatory system: Secondary | ICD-10-CM | POA: Diagnosis not present

## 2017-06-12 DIAGNOSIS — G3184 Mild cognitive impairment, so stated: Secondary | ICD-10-CM | POA: Diagnosis not present

## 2017-06-23 DIAGNOSIS — S66812D Strain of other specified muscles, fascia and tendons at wrist and hand level, left hand, subsequent encounter: Secondary | ICD-10-CM | POA: Diagnosis not present

## 2017-07-30 DIAGNOSIS — M179 Osteoarthritis of knee, unspecified: Secondary | ICD-10-CM | POA: Diagnosis not present

## 2017-07-30 DIAGNOSIS — I10 Essential (primary) hypertension: Secondary | ICD-10-CM | POA: Diagnosis not present

## 2017-07-30 DIAGNOSIS — J309 Allergic rhinitis, unspecified: Secondary | ICD-10-CM | POA: Diagnosis not present

## 2017-07-30 DIAGNOSIS — N400 Enlarged prostate without lower urinary tract symptoms: Secondary | ICD-10-CM | POA: Diagnosis not present

## 2017-10-27 DIAGNOSIS — I10 Essential (primary) hypertension: Secondary | ICD-10-CM | POA: Diagnosis not present

## 2017-10-27 DIAGNOSIS — Z0000 Encounter for general adult medical examination without abnormal findings: Secondary | ICD-10-CM | POA: Diagnosis not present

## 2017-10-27 DIAGNOSIS — C61 Malignant neoplasm of prostate: Secondary | ICD-10-CM | POA: Diagnosis not present

## 2017-10-27 DIAGNOSIS — Z0001 Encounter for general adult medical examination with abnormal findings: Secondary | ICD-10-CM | POA: Diagnosis not present

## 2017-10-27 DIAGNOSIS — E785 Hyperlipidemia, unspecified: Secondary | ICD-10-CM | POA: Diagnosis not present

## 2017-10-27 DIAGNOSIS — Z96659 Presence of unspecified artificial knee joint: Secondary | ICD-10-CM | POA: Diagnosis not present

## 2017-10-27 DIAGNOSIS — Z1331 Encounter for screening for depression: Secondary | ICD-10-CM | POA: Diagnosis not present

## 2017-10-27 DIAGNOSIS — Z1389 Encounter for screening for other disorder: Secondary | ICD-10-CM | POA: Diagnosis not present

## 2017-10-27 DIAGNOSIS — Z23 Encounter for immunization: Secondary | ICD-10-CM | POA: Diagnosis not present

## 2017-10-27 DIAGNOSIS — H269 Unspecified cataract: Secondary | ICD-10-CM | POA: Diagnosis not present

## 2017-10-27 DIAGNOSIS — N400 Enlarged prostate without lower urinary tract symptoms: Secondary | ICD-10-CM | POA: Diagnosis not present

## 2017-10-28 ENCOUNTER — Other Ambulatory Visit (HOSPITAL_COMMUNITY): Payer: Self-pay | Admitting: Respiratory Therapy

## 2017-10-28 DIAGNOSIS — J441 Chronic obstructive pulmonary disease with (acute) exacerbation: Secondary | ICD-10-CM

## 2017-11-18 ENCOUNTER — Ambulatory Visit (HOSPITAL_COMMUNITY): Admission: RE | Admit: 2017-11-18 | Payer: Medicare HMO | Source: Ambulatory Visit

## 2018-04-23 DIAGNOSIS — M179 Osteoarthritis of knee, unspecified: Secondary | ICD-10-CM | POA: Diagnosis not present

## 2018-04-23 DIAGNOSIS — I10 Essential (primary) hypertension: Secondary | ICD-10-CM | POA: Diagnosis not present

## 2018-10-06 IMAGING — CR DG CHEST 2V
2 series · 2 of 2 positions shown · non-contrast
Comparison: None.

CLINICAL DATA: Preop knee replacement

EXAM:
CHEST  2 VIEW

[w chest pa]
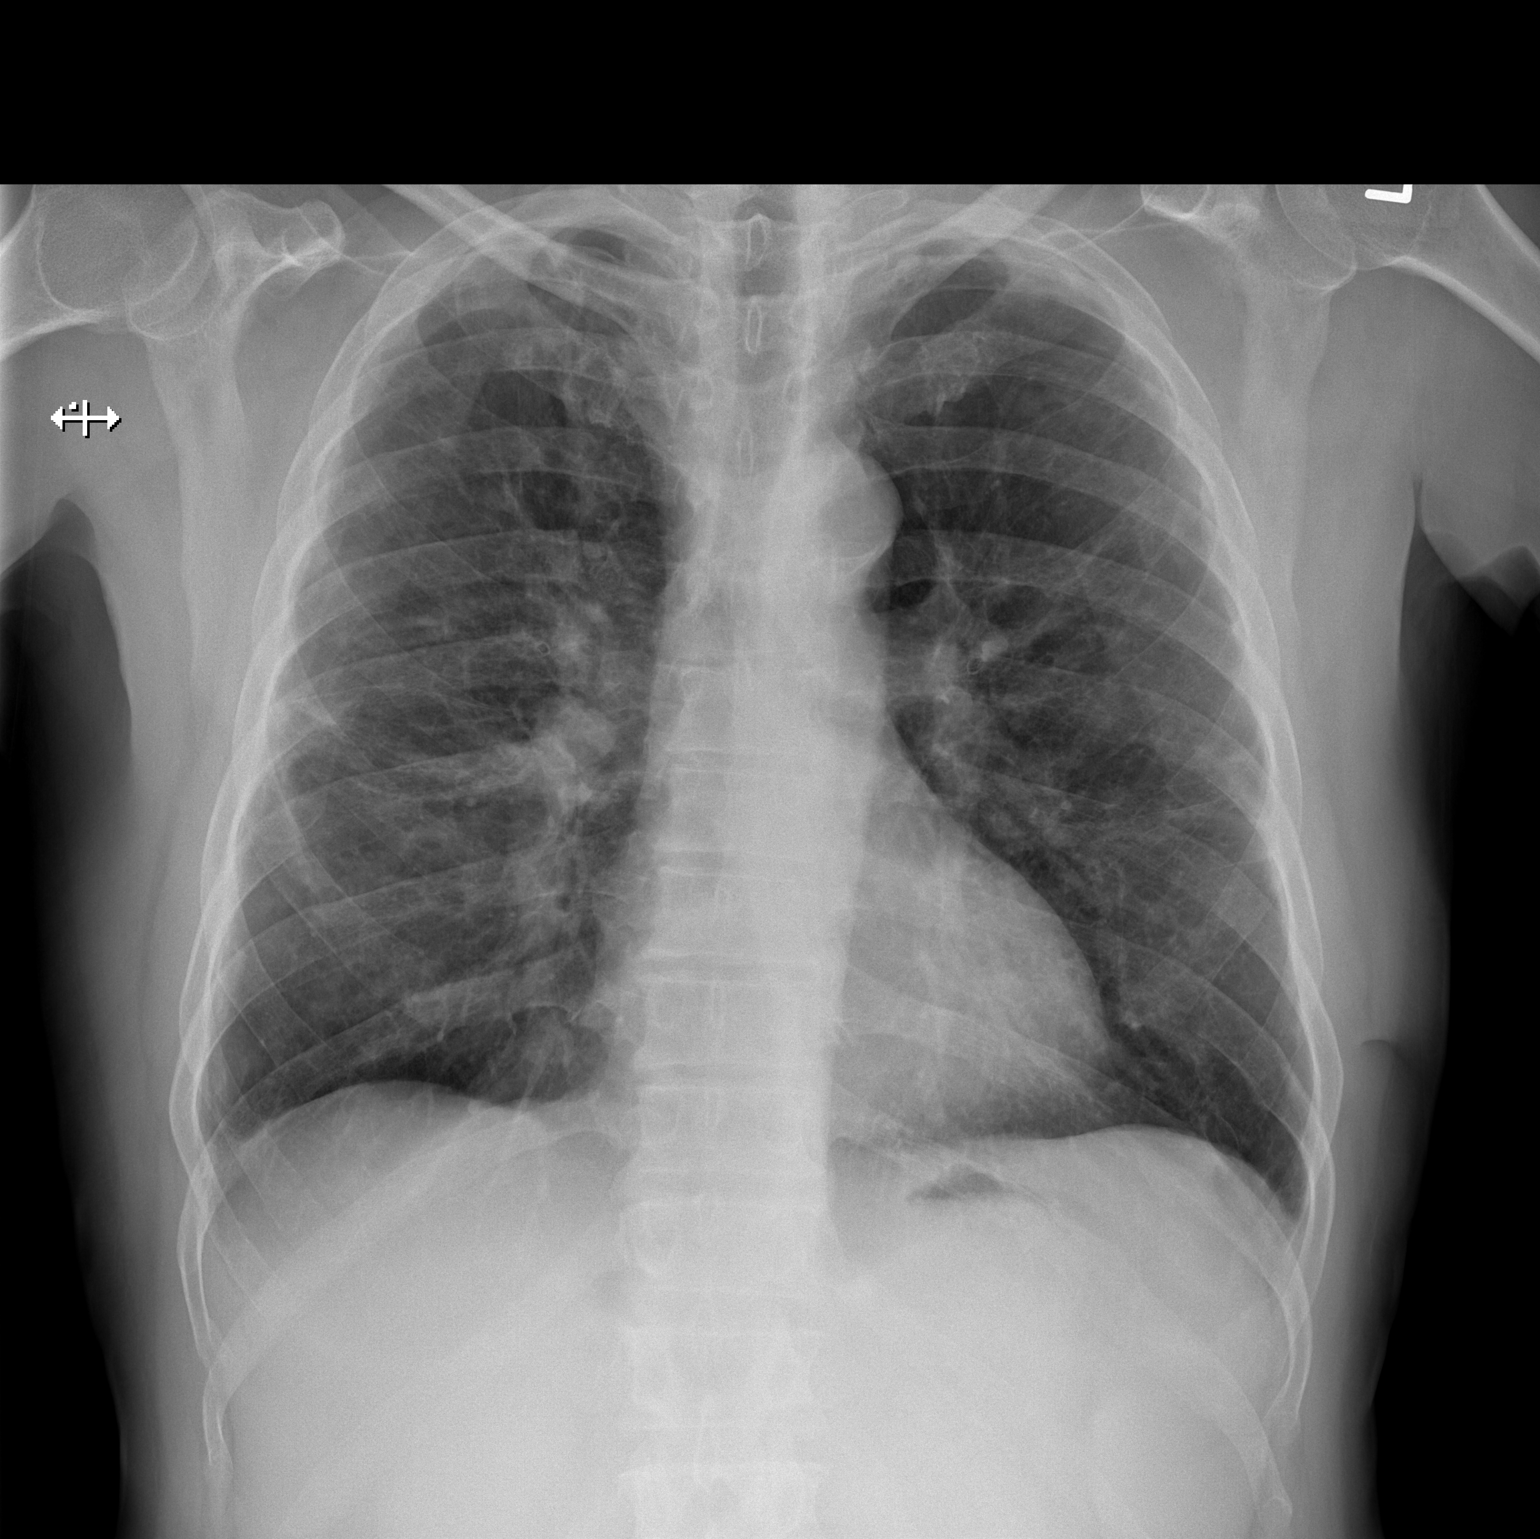

[w chest lat]
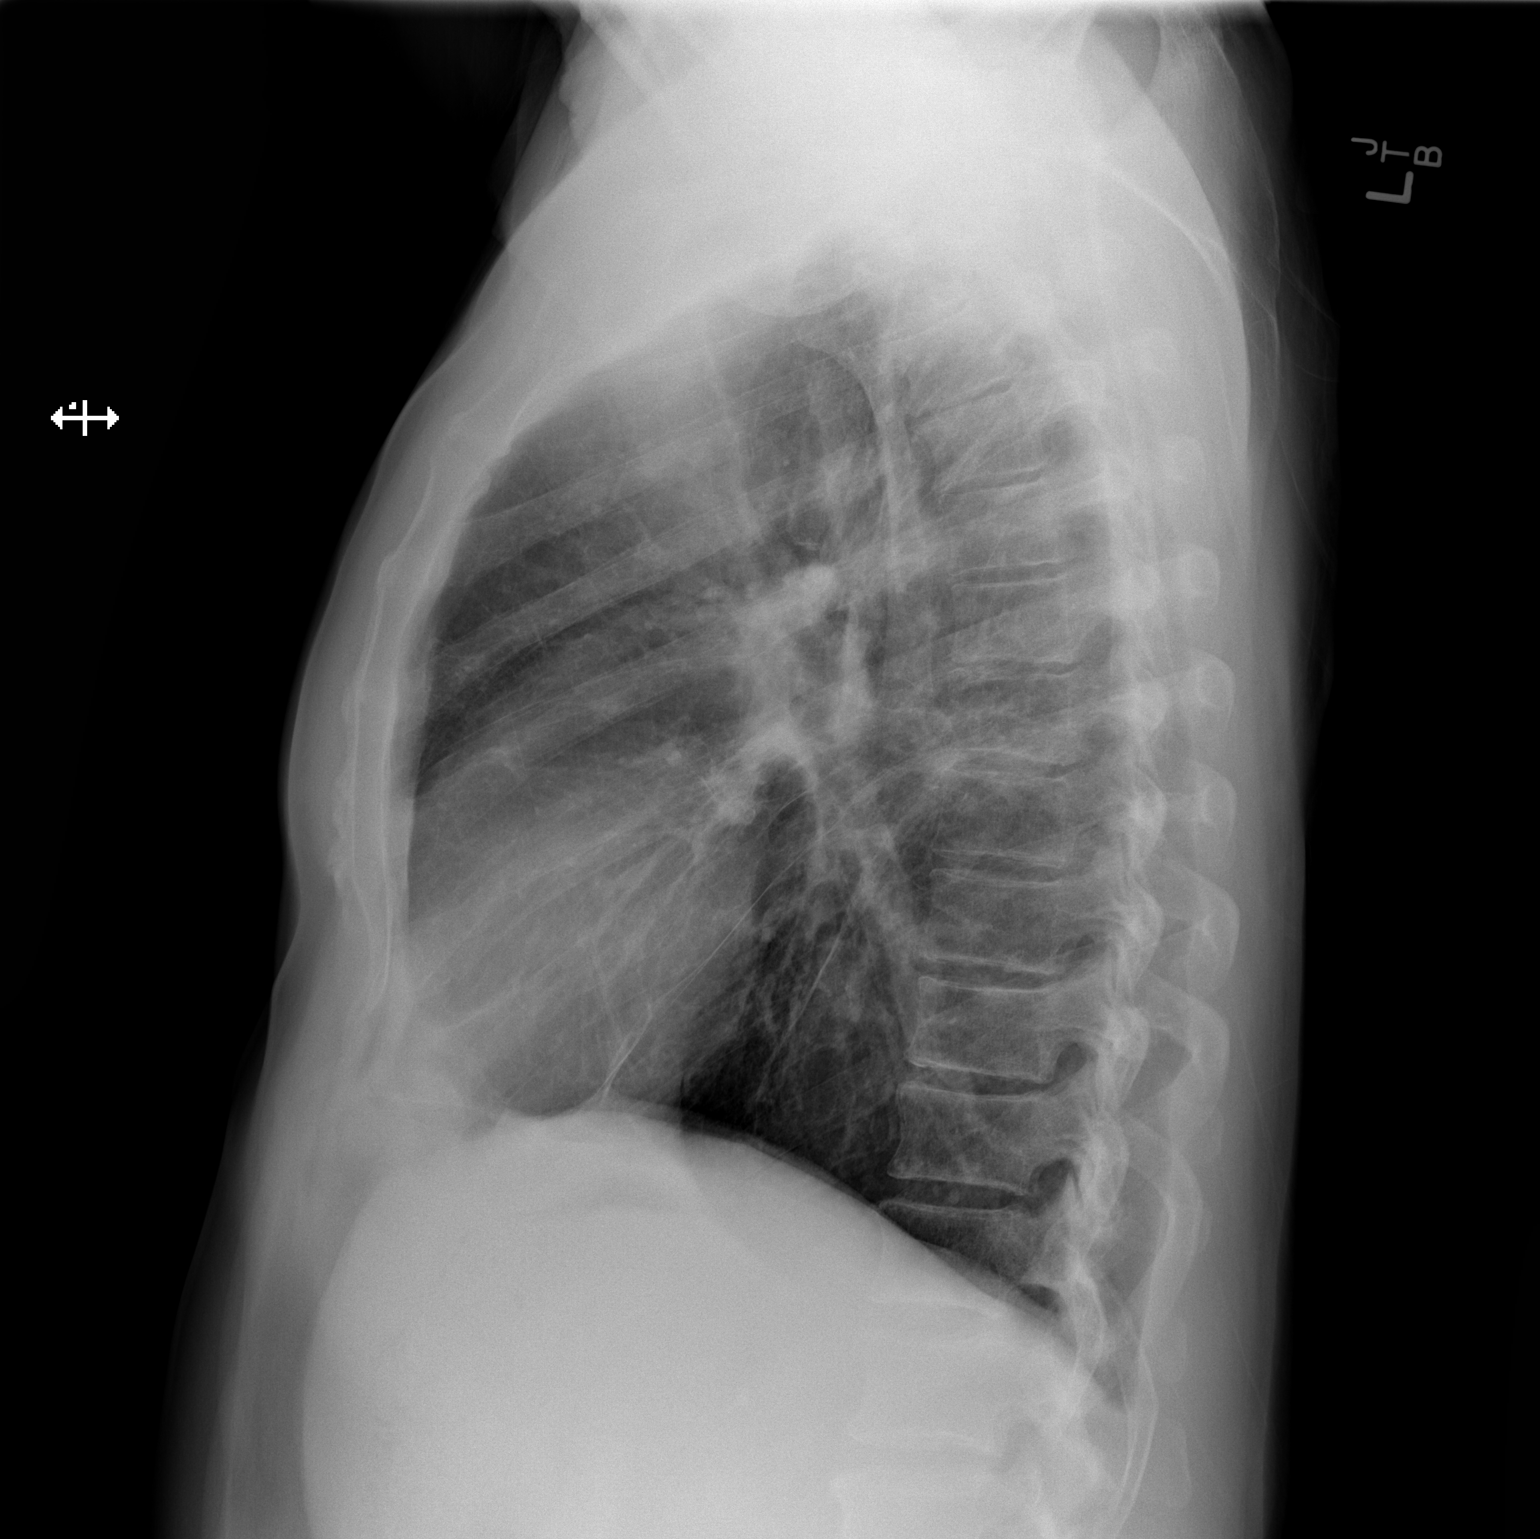

[2 of 2 positions shown; findings below may reference images not displayed]

FINDINGS: Increased interstitial markings peripherally in the mid and upper
lungs, likely scarring or early fibrosis. No confluent opacities.
Heart is normal size. No effusions or acute bony abnormality.
IMPRESSION: Peripheral interstitial prominence in the mid and upper lung zones,
likely scarring or fibrosis.

## 2018-10-08 ENCOUNTER — Other Ambulatory Visit (HOSPITAL_COMMUNITY)
Admission: RE | Admit: 2018-10-08 | Discharge: 2018-10-08 | Disposition: A | Discharge status: Home / Self Care | Payer: Medicare Other | Source: Ambulatory Visit | Attending: Internal Medicine | Admitting: Internal Medicine

## 2018-10-08 DIAGNOSIS — Z0001 Encounter for general adult medical examination with abnormal findings: Secondary | ICD-10-CM | POA: Insufficient documentation

## 2018-10-08 DIAGNOSIS — Z1389 Encounter for screening for other disorder: Secondary | ICD-10-CM | POA: Diagnosis not present

## 2018-10-08 DIAGNOSIS — Z23 Encounter for immunization: Secondary | ICD-10-CM | POA: Diagnosis not present

## 2018-10-08 DIAGNOSIS — I10 Essential (primary) hypertension: Secondary | ICD-10-CM | POA: Insufficient documentation

## 2018-10-08 DIAGNOSIS — M179 Osteoarthritis of knee, unspecified: Secondary | ICD-10-CM | POA: Diagnosis not present

## 2018-10-08 LAB — HEPATIC FUNCTION PANEL
ALT: 19 U/L (ref 0–44)
AST: 19 U/L (ref 15–41)
Albumin: 3.7 g/dL (ref 3.5–5.0)
Alkaline Phosphatase: 60 U/L (ref 38–126)
Bilirubin, Direct: <0.1 mg/dL (ref 0.0–0.2)
Total Bilirubin: 0.6 mg/dL (ref 0.3–1.2)
Total Protein: 7.5 g/dL (ref 6.5–8.1)

## 2018-10-08 LAB — CBC WITH DIFFERENTIAL/PLATELET
Abs Immature Granulocytes: 0 10*3/uL (ref 0.00–0.07)
Basophils Absolute: 0 10*3/uL (ref 0.0–0.1)
Basophils Relative: 1 %
Eosinophils Absolute: 0.2 10*3/uL (ref 0.0–0.5)
Eosinophils Relative: 4 %
HCT: 35.8 % — ABNORMAL LOW (ref 39.0–52.0)
Hemoglobin: 11.7 g/dL — ABNORMAL LOW (ref 13.0–17.0)
Immature Granulocytes: 0 %
Lymphocytes Relative: 27 %
Lymphs Abs: 1 10*3/uL (ref 0.7–4.0)
MCH: 30.1 pg (ref 26.0–34.0)
MCHC: 32.7 g/dL (ref 30.0–36.0)
MCV: 92 fL (ref 80.0–100.0)
Monocytes Absolute: 0.2 10*3/uL (ref 0.1–1.0)
Monocytes Relative: 7 %
Neutro Abs: 2.1 10*3/uL (ref 1.7–7.7)
Neutrophils Relative %: 61 %
Platelets: 227 10*3/uL (ref 150–400)
RBC: 3.89 MIL/uL — ABNORMAL LOW (ref 4.22–5.81)
RDW: 12.6 % (ref 11.5–15.5)
WBC: 3.5 10*3/uL — ABNORMAL LOW (ref 4.0–10.5)
nRBC: 0 % (ref 0.0–0.2)

## 2018-10-08 LAB — LIPID PANEL
Cholesterol: 160 mg/dL (ref 0–200)
HDL: 33 mg/dL — ABNORMAL LOW (ref >40)
LDL Cholesterol: 88 mg/dL (ref 0–99)
Total CHOL/HDL Ratio: 4.8 RATIO
Triglycerides: 196 mg/dL — ABNORMAL HIGH (ref <150)
VLDL: 39 mg/dL (ref 0–40)

## 2018-10-08 LAB — BASIC METABOLIC PANEL
Anion gap: 7 (ref 5–15)
BUN: 17 mg/dL (ref 8–23)
CO2: 25 mmol/L (ref 22–32)
Calcium: 9 mg/dL (ref 8.9–10.3)
Chloride: 105 mmol/L (ref 98–111)
Creatinine, Ser: 1.51 mg/dL — ABNORMAL HIGH (ref 0.61–1.24)
GFR calc Af Amer: 52 mL/min — ABNORMAL LOW (ref >60)
GFR calc non Af Amer: 45 mL/min — ABNORMAL LOW (ref >60)
Glucose, Bld: 102 mg/dL — ABNORMAL HIGH (ref 70–99)
Potassium: 4.1 mmol/L (ref 3.5–5.1)
Sodium: 137 mmol/L (ref 135–145)

## 2018-11-05 DIAGNOSIS — M179 Osteoarthritis of knee, unspecified: Secondary | ICD-10-CM | POA: Diagnosis not present

## 2018-11-05 DIAGNOSIS — I10 Essential (primary) hypertension: Secondary | ICD-10-CM | POA: Diagnosis not present

## 2018-11-11 DIAGNOSIS — M79671 Pain in right foot: Secondary | ICD-10-CM | POA: Diagnosis not present

## 2018-11-11 DIAGNOSIS — G5750 Tarsal tunnel syndrome, unspecified lower limb: Secondary | ICD-10-CM | POA: Diagnosis not present

## 2018-11-11 DIAGNOSIS — M79672 Pain in left foot: Secondary | ICD-10-CM | POA: Diagnosis not present

## 2018-11-11 DIAGNOSIS — M1990 Unspecified osteoarthritis, unspecified site: Secondary | ICD-10-CM | POA: Diagnosis not present

## 2018-11-11 DIAGNOSIS — M25579 Pain in unspecified ankle and joints of unspecified foot: Secondary | ICD-10-CM | POA: Diagnosis not present

## 2018-12-02 DIAGNOSIS — M79672 Pain in left foot: Secondary | ICD-10-CM | POA: Diagnosis not present

## 2018-12-02 DIAGNOSIS — M79671 Pain in right foot: Secondary | ICD-10-CM | POA: Diagnosis not present

## 2018-12-02 DIAGNOSIS — G5750 Tarsal tunnel syndrome, unspecified lower limb: Secondary | ICD-10-CM | POA: Diagnosis not present

## 2018-12-06 DIAGNOSIS — I10 Essential (primary) hypertension: Secondary | ICD-10-CM | POA: Diagnosis not present

## 2018-12-06 DIAGNOSIS — M179 Osteoarthritis of knee, unspecified: Secondary | ICD-10-CM | POA: Diagnosis not present

## 2018-12-23 DIAGNOSIS — M79672 Pain in left foot: Secondary | ICD-10-CM | POA: Diagnosis not present

## 2018-12-23 DIAGNOSIS — G5750 Tarsal tunnel syndrome, unspecified lower limb: Secondary | ICD-10-CM | POA: Diagnosis not present

## 2018-12-23 DIAGNOSIS — M79671 Pain in right foot: Secondary | ICD-10-CM | POA: Diagnosis not present

## 2019-01-05 DIAGNOSIS — M179 Osteoarthritis of knee, unspecified: Secondary | ICD-10-CM | POA: Diagnosis not present

## 2019-01-05 DIAGNOSIS — I10 Essential (primary) hypertension: Secondary | ICD-10-CM | POA: Diagnosis not present

## 2019-02-01 DIAGNOSIS — J40 Bronchitis, not specified as acute or chronic: Secondary | ICD-10-CM | POA: Diagnosis not present

## 2019-02-01 DIAGNOSIS — I10 Essential (primary) hypertension: Secondary | ICD-10-CM | POA: Diagnosis not present

## 2019-03-04 DIAGNOSIS — I10 Essential (primary) hypertension: Secondary | ICD-10-CM | POA: Diagnosis not present

## 2019-03-04 DIAGNOSIS — J40 Bronchitis, not specified as acute or chronic: Secondary | ICD-10-CM | POA: Diagnosis not present

## 2019-04-14 DIAGNOSIS — Z0001 Encounter for general adult medical examination with abnormal findings: Secondary | ICD-10-CM | POA: Diagnosis not present

## 2019-04-14 DIAGNOSIS — Z1389 Encounter for screening for other disorder: Secondary | ICD-10-CM | POA: Diagnosis not present

## 2019-04-14 DIAGNOSIS — Z0000 Encounter for general adult medical examination without abnormal findings: Secondary | ICD-10-CM | POA: Diagnosis not present

## 2019-04-14 DIAGNOSIS — I10 Essential (primary) hypertension: Secondary | ICD-10-CM | POA: Diagnosis not present

## 2019-04-14 DIAGNOSIS — K219 Gastro-esophageal reflux disease without esophagitis: Secondary | ICD-10-CM | POA: Diagnosis not present

## 2019-05-15 DIAGNOSIS — J40 Bronchitis, not specified as acute or chronic: Secondary | ICD-10-CM | POA: Diagnosis not present

## 2019-05-15 DIAGNOSIS — I10 Essential (primary) hypertension: Secondary | ICD-10-CM | POA: Diagnosis not present

## 2019-06-14 DIAGNOSIS — M179 Osteoarthritis of knee, unspecified: Secondary | ICD-10-CM | POA: Diagnosis not present

## 2019-06-14 DIAGNOSIS — I10 Essential (primary) hypertension: Secondary | ICD-10-CM | POA: Diagnosis not present

## 2019-07-15 DIAGNOSIS — I10 Essential (primary) hypertension: Secondary | ICD-10-CM | POA: Diagnosis not present

## 2019-07-15 DIAGNOSIS — M179 Osteoarthritis of knee, unspecified: Secondary | ICD-10-CM | POA: Diagnosis not present

## 2019-08-14 DIAGNOSIS — I10 Essential (primary) hypertension: Secondary | ICD-10-CM | POA: Diagnosis not present

## 2019-08-14 DIAGNOSIS — M179 Osteoarthritis of knee, unspecified: Secondary | ICD-10-CM | POA: Diagnosis not present

## 2019-09-14 DIAGNOSIS — M179 Osteoarthritis of knee, unspecified: Secondary | ICD-10-CM | POA: Diagnosis not present

## 2019-09-14 DIAGNOSIS — I10 Essential (primary) hypertension: Secondary | ICD-10-CM | POA: Diagnosis not present

## 2019-09-22 DIAGNOSIS — J309 Allergic rhinitis, unspecified: Secondary | ICD-10-CM | POA: Diagnosis not present

## 2019-09-22 DIAGNOSIS — H269 Unspecified cataract: Secondary | ICD-10-CM | POA: Diagnosis not present

## 2019-09-22 DIAGNOSIS — J40 Bronchitis, not specified as acute or chronic: Secondary | ICD-10-CM | POA: Diagnosis not present

## 2019-09-22 DIAGNOSIS — M179 Osteoarthritis of knee, unspecified: Secondary | ICD-10-CM | POA: Diagnosis not present

## 2019-09-22 DIAGNOSIS — Z049 Encounter for examination and observation for unspecified reason: Secondary | ICD-10-CM | POA: Diagnosis not present

## 2019-09-22 DIAGNOSIS — I10 Essential (primary) hypertension: Secondary | ICD-10-CM | POA: Diagnosis not present

## 2019-09-27 DIAGNOSIS — M179 Osteoarthritis of knee, unspecified: Secondary | ICD-10-CM | POA: Diagnosis not present

## 2019-09-27 DIAGNOSIS — I10 Essential (primary) hypertension: Secondary | ICD-10-CM | POA: Diagnosis not present

## 2019-09-27 DIAGNOSIS — N1832 Chronic kidney disease, stage 3b: Secondary | ICD-10-CM | POA: Diagnosis not present

## 2019-09-30 DIAGNOSIS — N183 Chronic kidney disease, stage 3 unspecified: Secondary | ICD-10-CM | POA: Diagnosis not present

## 2019-10-06 DIAGNOSIS — D638 Anemia in other chronic diseases classified elsewhere: Secondary | ICD-10-CM | POA: Diagnosis not present

## 2019-10-06 DIAGNOSIS — I129 Hypertensive chronic kidney disease with stage 1 through stage 4 chronic kidney disease, or unspecified chronic kidney disease: Secondary | ICD-10-CM | POA: Diagnosis not present

## 2019-10-06 DIAGNOSIS — N1832 Chronic kidney disease, stage 3b: Secondary | ICD-10-CM | POA: Diagnosis not present

## 2019-10-06 DIAGNOSIS — N170 Acute kidney failure with tubular necrosis: Secondary | ICD-10-CM | POA: Diagnosis not present

## 2019-10-06 DIAGNOSIS — Z79899 Other long term (current) drug therapy: Secondary | ICD-10-CM | POA: Diagnosis not present

## 2019-10-07 ENCOUNTER — Other Ambulatory Visit: Payer: Self-pay | Admitting: Nephrology

## 2019-10-07 ENCOUNTER — Other Ambulatory Visit (HOSPITAL_COMMUNITY): Payer: Self-pay | Admitting: Nephrology

## 2019-10-07 DIAGNOSIS — N170 Acute kidney failure with tubular necrosis: Secondary | ICD-10-CM

## 2019-10-07 DIAGNOSIS — Z79899 Other long term (current) drug therapy: Secondary | ICD-10-CM

## 2019-10-07 DIAGNOSIS — I129 Hypertensive chronic kidney disease with stage 1 through stage 4 chronic kidney disease, or unspecified chronic kidney disease: Secondary | ICD-10-CM

## 2019-10-07 DIAGNOSIS — D638 Anemia in other chronic diseases classified elsewhere: Secondary | ICD-10-CM

## 2019-10-11 DIAGNOSIS — N170 Acute kidney failure with tubular necrosis: Secondary | ICD-10-CM | POA: Diagnosis not present

## 2019-10-11 DIAGNOSIS — I129 Hypertensive chronic kidney disease with stage 1 through stage 4 chronic kidney disease, or unspecified chronic kidney disease: Secondary | ICD-10-CM | POA: Diagnosis not present

## 2019-10-11 DIAGNOSIS — N1832 Chronic kidney disease, stage 3b: Secondary | ICD-10-CM | POA: Diagnosis not present

## 2019-10-11 DIAGNOSIS — Z79899 Other long term (current) drug therapy: Secondary | ICD-10-CM | POA: Diagnosis not present

## 2019-10-11 DIAGNOSIS — D638 Anemia in other chronic diseases classified elsewhere: Secondary | ICD-10-CM | POA: Diagnosis not present

## 2019-10-14 ENCOUNTER — Ambulatory Visit (HOSPITAL_COMMUNITY)
Admission: RE | Admit: 2019-10-14 | Discharge: 2019-10-14 | Disposition: A | Discharge status: Home / Self Care | Payer: Medicare Other | Source: Ambulatory Visit | Attending: Nephrology | Admitting: Nephrology

## 2019-10-14 ENCOUNTER — Other Ambulatory Visit: Payer: Self-pay

## 2019-10-14 DIAGNOSIS — N170 Acute kidney failure with tubular necrosis: Secondary | ICD-10-CM | POA: Insufficient documentation

## 2019-10-14 DIAGNOSIS — N179 Acute kidney failure, unspecified: Secondary | ICD-10-CM | POA: Diagnosis not present

## 2019-10-14 DIAGNOSIS — I129 Hypertensive chronic kidney disease with stage 1 through stage 4 chronic kidney disease, or unspecified chronic kidney disease: Secondary | ICD-10-CM | POA: Diagnosis not present

## 2019-10-14 DIAGNOSIS — D638 Anemia in other chronic diseases classified elsewhere: Secondary | ICD-10-CM

## 2019-10-14 DIAGNOSIS — Z79899 Other long term (current) drug therapy: Secondary | ICD-10-CM | POA: Diagnosis not present

## 2019-10-27 DIAGNOSIS — I10 Essential (primary) hypertension: Secondary | ICD-10-CM | POA: Diagnosis not present

## 2019-11-03 DIAGNOSIS — N1832 Chronic kidney disease, stage 3b: Secondary | ICD-10-CM | POA: Diagnosis not present

## 2019-11-03 DIAGNOSIS — N170 Acute kidney failure with tubular necrosis: Secondary | ICD-10-CM | POA: Diagnosis not present

## 2019-11-03 DIAGNOSIS — D508 Other iron deficiency anemias: Secondary | ICD-10-CM | POA: Diagnosis not present

## 2019-11-03 DIAGNOSIS — D638 Anemia in other chronic diseases classified elsewhere: Secondary | ICD-10-CM | POA: Diagnosis not present

## 2019-11-03 DIAGNOSIS — I129 Hypertensive chronic kidney disease with stage 1 through stage 4 chronic kidney disease, or unspecified chronic kidney disease: Secondary | ICD-10-CM | POA: Diagnosis not present

## 2019-11-21 ENCOUNTER — Encounter: Payer: Self-pay | Admitting: Internal Medicine

## 2019-11-21 ENCOUNTER — Encounter (HOSPITAL_COMMUNITY): Payer: Self-pay

## 2019-11-21 ENCOUNTER — Encounter (HOSPITAL_COMMUNITY)
Admission: RE | Admit: 2019-11-21 | Discharge: 2019-11-21 | Disposition: A | Discharge status: Home / Self Care | Payer: Medicare Other | Source: Ambulatory Visit | Attending: Nephrology | Admitting: Nephrology

## 2019-11-21 ENCOUNTER — Other Ambulatory Visit: Payer: Self-pay

## 2019-11-21 DIAGNOSIS — D509 Iron deficiency anemia, unspecified: Secondary | ICD-10-CM | POA: Diagnosis not present

## 2019-11-21 HISTORY — DX: Anemia, unspecified: D64.9

## 2019-11-21 MED ORDER — SODIUM CHLORIDE 0.9 % IV SOLN: Freq: Once | INTRAVENOUS | Status: AC

## 2019-11-21 MED ORDER — SODIUM CHLORIDE 0.9 % IV SOLN
510.0000 mg | Freq: Once | INTRAVENOUS | Status: AC
  Administered 2019-11-21: 510 mg via INTRAVENOUS
  Filled 2019-11-21: qty 510

## 2019-11-27 DIAGNOSIS — M179 Osteoarthritis of knee, unspecified: Secondary | ICD-10-CM | POA: Diagnosis not present

## 2019-11-27 DIAGNOSIS — I10 Essential (primary) hypertension: Secondary | ICD-10-CM | POA: Diagnosis not present

## 2019-11-28 ENCOUNTER — Encounter (HOSPITAL_COMMUNITY): Payer: Self-pay

## 2019-11-28 ENCOUNTER — Other Ambulatory Visit: Payer: Self-pay

## 2019-11-28 ENCOUNTER — Encounter (HOSPITAL_COMMUNITY)
Admission: RE | Admit: 2019-11-28 | Discharge: 2019-11-28 | Disposition: A | Discharge status: Home / Self Care | Payer: Medicare Other | Source: Ambulatory Visit | Attending: Nephrology | Admitting: Nephrology

## 2019-11-28 DIAGNOSIS — D509 Iron deficiency anemia, unspecified: Secondary | ICD-10-CM | POA: Diagnosis not present

## 2019-11-28 MED ORDER — SODIUM CHLORIDE 0.9 % IV SOLN
510.0000 mg | Freq: Once | INTRAVENOUS | Status: AC
  Administered 2019-11-28: 510 mg via INTRAVENOUS
  Filled 2019-11-28: qty 17

## 2019-11-28 MED ORDER — SODIUM CHLORIDE 0.9 % IV SOLN: Freq: Once | INTRAVENOUS | Status: AC

## 2019-12-27 DIAGNOSIS — M179 Osteoarthritis of knee, unspecified: Secondary | ICD-10-CM | POA: Diagnosis not present

## 2019-12-27 DIAGNOSIS — I10 Essential (primary) hypertension: Secondary | ICD-10-CM | POA: Diagnosis not present

## 2019-12-28 ENCOUNTER — Other Ambulatory Visit: Payer: Self-pay

## 2019-12-28 ENCOUNTER — Ambulatory Visit: Payer: Medicare Other | Admitting: Gastroenterology

## 2019-12-28 ENCOUNTER — Encounter: Payer: Self-pay | Admitting: Gastroenterology

## 2019-12-28 ENCOUNTER — Telehealth: Payer: Self-pay

## 2019-12-28 DIAGNOSIS — D509 Iron deficiency anemia, unspecified: Secondary | ICD-10-CM | POA: Diagnosis not present

## 2019-12-28 NOTE — Patient Instructions (Signed)
1. Colonoscopy as scheduled. Please see separate instructions. 

## 2019-12-28 NOTE — H&P (View-Only) (Signed)
Primary Care Physician:  Rosita Fire, MD  Primary Gastroenterologist:  Garfield Cornea, MD   Chief Complaint  Patient presents with  . Anemia    HPI:  Brent Payne is a 74 y.o. male here at the request of Dr. Theador Hawthorne for further evaluation of iron deficiency anemia.  Received 2 iron infusions last month.  Labs from October 11, 2019: Creatinine 1.54, BUN 15, A1c 5.4, hemoglobin 11.3, hematocrit 33.3, iron 56, ferritin 65, iron saturations 18%, AST 18, ALT 20, total bilirubin 0.5, alkaline phosphatase 63.   Rare heartburn, only if eats trigger foods. No weight loss. No n/v. No abdominal pain. BM regular. No melena, brbpr. Last colonoscopy over six years ago at [facility], says it was normal. Taking some iron pill daily. No ASA powders. Used to take some ibuprofen, aleve but stopped about two months ago due to renal issues. Stays active. No fatigue. Feels good. Plays golf three days per week.  Current Outpatient Medications  Medication Sig Dispense Refill  . acetaminophen (TYLENOL) 500 MG tablet Take 1,000 mg by mouth every 6 (six) hours as needed for mild pain.    Marland Kitchen losartan-hydrochlorothiazide (HYZAAR) 50-12.5 MG tablet Take 1 tablet by mouth every evening.    . tamsulosin (FLOMAX) 0.4 MG CAPS capsule Take 0.4 mg by mouth daily after supper.      No current facility-administered medications for this visit.    Allergies as of 12/28/2019 - Review Complete 12/28/2019  Allergen Reaction Noted  . No known allergies  05/09/2016    Past Medical History:  Diagnosis Date  . Allergy   . Anemia   . CKD (chronic kidney disease)   . Hypertension   . Primary localized osteoarthritis of left knee   . Primary localized osteoarthritis of left knee   . Prostate disease     Past Surgical History:  Procedure Laterality Date  . CATARACT EXTRACTION W/PHACO Right 05/16/2014   Procedure: CATARACT EXTRACTION PHACO AND INTRAOCULAR LENS PLACEMENT (IOC);  Surgeon: Rutherford Guys, MD;  Location: [facility];   Service: Ophthalmology;  Laterality: Right;  CDE:6.38  . KNEE ARTHROSCOPY Left    30+ years ago  . 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   Procedure: LEFT TOTAL KNEE ARTHROPLASTY;  Surgeon: Elsie Saas, MD;  Location: [place];  Service: Orthopedics;  Laterality: Left;    Family History  Problem Relation Age of Onset  . Other Mother   . Hypertension Mother   . Other Father   . Hypertension Father   . Colon cancer Neg Hx     Social History   Socioeconomic History  . Marital status: Married    Spouse name: Not on file  . Number of children: Not on file  . Years of education: Not on file  . Highest education level: Not on file  Occupational History  . Not on file  Tobacco Use  . Smoking status: Former Smoker    Packs/day: 0.50    Quit date: 05/11/2003    Years since quitting: 16.6  . Smokeless tobacco: Never Used  Substance and Sexual Activity  . Alcohol use: No  . Drug use: No  . Sexual activity: Not on file  Other Topics Concern  . Not on file  Social History Narrative  . Not on file   Social Determinants of Health   Financial Resource Strain:   . Difficulty of Paying Living Expenses: Not on file  Food Insecurity:   . Worried About Charity fundraiser in the Last Year:  Not on file  . Ran Out of Food in the Last Year: Not on file  Transportation Needs:   . Lack of Transportation (Medical): Not on file  . Lack of Transportation (Non-Medical): Not on file  Physical Activity:   . Days of Exercise per Week: Not on file  . Minutes of Exercise per Session: Not on file  Stress:   . Feeling of Stress : Not on file  Social Connections:   . Frequency of Communication with Friends and Family: Not on file  . Frequency of Social Gatherings with Friends and Family: Not on file  . Attends Religious Services: Not on file  . Active Member of Clubs or Organizations: Not on file  . Attends Archivist Meetings: Not on file  . Marital Status: Not on file  Intimate  Partner Violence:   . Fear of Current or Ex-Partner: Not on file  . Emotionally Abused: Not on file  . Physically Abused: Not on file  . Sexually Abused: Not on file      ROS:  General: Negative for anorexia, weight loss, fever, chills, fatigue, weakness. Eyes: Negative for vision changes.  ENT: Negative for hoarseness, difficulty swallowing , nasal congestion. CV: Negative for chest pain, angina, palpitations, dyspnea on exertion, peripheral edema.  Respiratory: Negative for dyspnea at rest, dyspnea on exertion, cough, sputum, wheezing.  GI: See history of present illness. GU:  Negative for dysuria, hematuria, urinary incontinence, urinary frequency, nocturnal urination.  MS: Negative for low back pain. Some knee pain or shoulder pain at times. Derm: Negative for rash or itching.  Neuro: Negative for weakness, abnormal sensation, seizure, frequent headaches, memory loss, confusion.  Psych: Negative for anxiety, depression, suicidal ideation, hallucinations.  Endo: Negative for unusual weight change.  Heme: Negative for bruising or bleeding. Allergy: Negative for rash or hives.    Physical Examination:  BP 137/83   Pulse (!) 57   Temp 98.2 F (36.8 C)   Ht 5\' 9"  (1.753 m)   Wt 176 lb 3.2 oz (79.9 kg)   BMI 26.02 kg/m    General: Well-nourished, well-developed in no acute distress.  Head: Normocephalic, atraumatic.   Eyes: Conjunctiva pink, no icterus. Mouth: masked Neck: Supple without thyromegaly, masses, or lymphadenopathy.  Lungs: Clear to auscultation bilaterally.  Heart: Regular rate and rhythm, no murmurs rubs or gallops.  Abdomen: Bowel sounds are normal, nontender, nondistended, no hepatosplenomegaly or masses, no abdominal bruits or    hernia , no rebound or guarding.   Rectal: not performed Extremities: No lower extremity edema. No clubbing or deformities.  Neuro: Alert and oriented x 4 , grossly normal neurologically.  Skin: Warm and dry, no rash or  jaundice.   Psych: Alert and cooperative, normal mood and affect.  Labs: See hpi  Imaging Studies: No results found.  Impression/Plan:  74 year old male presenting for further evaluation of iron deficiency anemia, sent by his nephrologist Dr. Theador Hawthorne.  Patient recalls having a colonoscopy greater than 6 years ago, normal findings.  Denies any GI symptoms.  Would offer colonoscopy for further evaluation of IDA.  Appropriate for conscious sedation. ASA II.  I have discussed the risks, alternatives, benefits with regards to but not limited to the risk of reaction to medication, bleeding, infection, perforation and the patient is agreeable to proceed. Written consent to be obtained.

## 2019-12-28 NOTE — Telephone Encounter (Signed)
Neil Crouch PA advised for pt to hold Iron for 7 days prior to upcoming TCS. Holding Iron was placed on procedure instructions that were mailed to pt.  Called and informed pt's wife he will need to hold Iron for 7 days prior to TCS starting 01/11/20.

## 2019-12-28 NOTE — Progress Notes (Signed)
Primary Care Physician:  Rosita Fire, MD  Primary Gastroenterologist:  Garfield Cornea, MD   Chief Complaint  Patient presents with  . Anemia    HPI:  Brent Payne is a 74 y.o. male here at the request of Dr. Theador Hawthorne for further evaluation of iron deficiency anemia.  Received 2 iron infusions last month.  Labs from October 11, 2019: Creatinine 1.54, BUN 15, A1c 5.4, hemoglobin 11.3, hematocrit 33.3, iron 56, ferritin 65, iron saturations 18%, AST 18, ALT 20, total bilirubin 0.5, alkaline phosphatase 63.   Rare heartburn, only if eats trigger foods. No weight loss. No n/v. No abdominal pain. BM regular. No melena, brbpr. Last colonoscopy over six years ago at [facility], says it was normal. Taking some iron pill daily. No ASA powders. Used to take some ibuprofen, aleve but stopped about two months ago due to renal issues. Stays active. No fatigue. Feels good. Plays golf three days per week.  Current Outpatient Medications  Medication Sig Dispense Refill  . acetaminophen (TYLENOL) 500 MG tablet Take 1,000 mg by mouth every 6 (six) hours as needed for mild pain.    Marland Kitchen losartan-hydrochlorothiazide (HYZAAR) 50-12.5 MG tablet Take 1 tablet by mouth every evening.    . tamsulosin (FLOMAX) 0.4 MG CAPS capsule Take 0.4 mg by mouth daily after supper.      No current facility-administered medications for this visit.    Allergies as of 12/28/2019 - Review Complete 12/28/2019  Allergen Reaction Noted  . No known allergies  05/09/2016    Past Medical History:  Diagnosis Date  . Allergy   . Anemia   . CKD (chronic kidney disease)   . Hypertension   . Primary localized osteoarthritis of left knee   . Primary localized osteoarthritis of left knee   . Prostate disease     Past Surgical History:  Procedure Laterality Date  . CATARACT EXTRACTION W/PHACO Right 05/16/2014   Procedure: CATARACT EXTRACTION PHACO AND INTRAOCULAR LENS PLACEMENT (IOC);  Surgeon: Rutherford Guys, MD;  Location: [facility];   Service: Ophthalmology;  Laterality: Right;  CDE:6.38  . KNEE ARTHROSCOPY Left    30+ years ago  . 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   Procedure: LEFT TOTAL KNEE ARTHROPLASTY;  Surgeon: Elsie Saas, MD;  Location: [place];  Service: Orthopedics;  Laterality: Left;    Family History  Problem Relation Age of Onset  . Other Mother   . Hypertension Mother   . Other Father   . Hypertension Father   . Colon cancer Neg Hx     Social History   Socioeconomic History  . Marital status: Married    Spouse name: Not on file  . Number of children: Not on file  . Years of education: Not on file  . Highest education level: Not on file  Occupational History  . Not on file  Tobacco Use  . Smoking status: Former Smoker    Packs/day: 0.50    Quit date: 05/11/2003    Years since quitting: 16.6  . Smokeless tobacco: Never Used  Substance and Sexual Activity  . Alcohol use: No  . Drug use: No  . Sexual activity: Not on file  Other Topics Concern  . Not on file  Social History Narrative  . Not on file   Social Determinants of Health   Financial Resource Strain:   . Difficulty of Paying Living Expenses: Not on file  Food Insecurity:   . Worried About Charity fundraiser in the Last Year:  Not on file  . Ran Out of Food in the Last Year: Not on file  Transportation Needs:   . Lack of Transportation (Medical): Not on file  . Lack of Transportation (Non-Medical): Not on file  Physical Activity:   . Days of Exercise per Week: Not on file  . Minutes of Exercise per Session: Not on file  Stress:   . Feeling of Stress : Not on file  Social Connections:   . Frequency of Communication with Friends and Family: Not on file  . Frequency of Social Gatherings with Friends and Family: Not on file  . Attends Religious Services: Not on file  . Active Member of Clubs or Organizations: Not on file  . Attends Archivist Meetings: Not on file  . Marital Status: Not on file  Intimate  Partner Violence:   . Fear of Current or Ex-Partner: Not on file  . Emotionally Abused: Not on file  . Physically Abused: Not on file  . Sexually Abused: Not on file      ROS:  General: Negative for anorexia, weight loss, fever, chills, fatigue, weakness. Eyes: Negative for vision changes.  ENT: Negative for hoarseness, difficulty swallowing , nasal congestion. CV: Negative for chest pain, angina, palpitations, dyspnea on exertion, peripheral edema.  Respiratory: Negative for dyspnea at rest, dyspnea on exertion, cough, sputum, wheezing.  GI: See history of present illness. GU:  Negative for dysuria, hematuria, urinary incontinence, urinary frequency, nocturnal urination.  MS: Negative for low back pain. Some knee pain or shoulder pain at times. Derm: Negative for rash or itching.  Neuro: Negative for weakness, abnormal sensation, seizure, frequent headaches, memory loss, confusion.  Psych: Negative for anxiety, depression, suicidal ideation, hallucinations.  Endo: Negative for unusual weight change.  Heme: Negative for bruising or bleeding. Allergy: Negative for rash or hives.    Physical Examination:  BP 137/83   Pulse (!) 57   Temp 98.2 F (36.8 C)   Ht 5\' 9"  (1.753 m)   Wt 176 lb 3.2 oz (79.9 kg)   BMI 26.02 kg/m    General: Well-nourished, well-developed in no acute distress.  Head: Normocephalic, atraumatic.   Eyes: Conjunctiva pink, no icterus. Mouth: masked Neck: Supple without thyromegaly, masses, or lymphadenopathy.  Lungs: Clear to auscultation bilaterally.  Heart: Regular rate and rhythm, no murmurs rubs or gallops.  Abdomen: Bowel sounds are normal, nontender, nondistended, no hepatosplenomegaly or masses, no abdominal bruits or    hernia , no rebound or guarding.   Rectal: not performed Extremities: No lower extremity edema. No clubbing or deformities.  Neuro: Alert and oriented x 4 , grossly normal neurologically.  Skin: Warm and dry, no rash or  jaundice.   Psych: Alert and cooperative, normal mood and affect.  Labs: See hpi  Imaging Studies: No results found.  Impression/Plan:  74 year old male presenting for further evaluation of iron deficiency anemia, sent by his nephrologist Dr. Theador Hawthorne.  Patient recalls having a colonoscopy greater than 6 years ago, normal findings.  Denies any GI symptoms.  Would offer colonoscopy for further evaluation of IDA.  Appropriate for conscious sedation. ASA II.  I have discussed the risks, alternatives, benefits with regards to but not limited to the risk of reaction to medication, bleeding, infection, perforation and the patient is agreeable to proceed. Written consent to be obtained.

## 2020-01-04 DIAGNOSIS — N170 Acute kidney failure with tubular necrosis: Secondary | ICD-10-CM | POA: Diagnosis not present

## 2020-01-04 DIAGNOSIS — Z23 Encounter for immunization: Secondary | ICD-10-CM | POA: Diagnosis not present

## 2020-01-04 DIAGNOSIS — D508 Other iron deficiency anemias: Secondary | ICD-10-CM | POA: Diagnosis not present

## 2020-01-04 DIAGNOSIS — D638 Anemia in other chronic diseases classified elsewhere: Secondary | ICD-10-CM | POA: Diagnosis not present

## 2020-01-04 DIAGNOSIS — I129 Hypertensive chronic kidney disease with stage 1 through stage 4 chronic kidney disease, or unspecified chronic kidney disease: Secondary | ICD-10-CM | POA: Diagnosis not present

## 2020-01-04 DIAGNOSIS — N1832 Chronic kidney disease, stage 3b: Secondary | ICD-10-CM | POA: Diagnosis not present

## 2020-01-05 DIAGNOSIS — N1832 Chronic kidney disease, stage 3b: Secondary | ICD-10-CM | POA: Diagnosis not present

## 2020-01-05 DIAGNOSIS — D508 Other iron deficiency anemias: Secondary | ICD-10-CM | POA: Diagnosis not present

## 2020-01-05 DIAGNOSIS — D638 Anemia in other chronic diseases classified elsewhere: Secondary | ICD-10-CM | POA: Diagnosis not present

## 2020-01-05 DIAGNOSIS — I129 Hypertensive chronic kidney disease with stage 1 through stage 4 chronic kidney disease, or unspecified chronic kidney disease: Secondary | ICD-10-CM | POA: Diagnosis not present

## 2020-01-17 ENCOUNTER — Other Ambulatory Visit (HOSPITAL_COMMUNITY): Payer: Medicare Other

## 2020-01-17 ENCOUNTER — Other Ambulatory Visit (HOSPITAL_COMMUNITY)
Admission: RE | Admit: 2020-01-17 | Discharge: 2020-01-17 | Disposition: A | Discharge status: Home / Self Care | Payer: Medicare Other | Source: Ambulatory Visit | Attending: Internal Medicine | Admitting: Internal Medicine

## 2020-01-17 ENCOUNTER — Other Ambulatory Visit: Payer: Self-pay

## 2020-01-17 DIAGNOSIS — Z01812 Encounter for preprocedural laboratory examination: Secondary | ICD-10-CM | POA: Insufficient documentation

## 2020-01-17 DIAGNOSIS — Z20822 Contact with and (suspected) exposure to covid-19: Secondary | ICD-10-CM | POA: Insufficient documentation

## 2020-01-17 LAB — SARS CORONAVIRUS 2 (TAT 6-24 HRS): SARS Coronavirus 2: NEGATIVE

## 2020-01-18 ENCOUNTER — Ambulatory Visit (HOSPITAL_COMMUNITY)
Admission: RE | Admit: 2020-01-18 | Discharge: 2020-01-18 | Disposition: A | Discharge status: Home / Self Care | Payer: Medicare Other | Attending: Internal Medicine | Admitting: Internal Medicine

## 2020-01-18 ENCOUNTER — Other Ambulatory Visit: Payer: Self-pay

## 2020-01-18 ENCOUNTER — Encounter (HOSPITAL_COMMUNITY)
Admission: RE | Disposition: A | Discharge status: Home / Self Care | Payer: Self-pay | Source: Home / Self Care | Attending: Internal Medicine

## 2020-01-18 ENCOUNTER — Encounter (HOSPITAL_COMMUNITY): Payer: Self-pay | Admitting: Internal Medicine

## 2020-01-18 DIAGNOSIS — Z96652 Presence of left artificial knee joint: Secondary | ICD-10-CM | POA: Insufficient documentation

## 2020-01-18 DIAGNOSIS — Z87891 Personal history of nicotine dependence: Secondary | ICD-10-CM | POA: Insufficient documentation

## 2020-01-18 DIAGNOSIS — Z79899 Other long term (current) drug therapy: Secondary | ICD-10-CM | POA: Diagnosis not present

## 2020-01-18 DIAGNOSIS — K635 Polyp of colon: Secondary | ICD-10-CM

## 2020-01-18 DIAGNOSIS — K5730 Diverticulosis of large intestine without perforation or abscess without bleeding: Secondary | ICD-10-CM | POA: Insufficient documentation

## 2020-01-18 DIAGNOSIS — D509 Iron deficiency anemia, unspecified: Secondary | ICD-10-CM | POA: Insufficient documentation

## 2020-01-18 DIAGNOSIS — D120 Benign neoplasm of cecum: Secondary | ICD-10-CM | POA: Insufficient documentation

## 2020-01-18 HISTORY — PX: COLONOSCOPY: SHX5424

## 2020-01-18 HISTORY — PX: POLYPECTOMY: SHX5525

## 2020-01-18 SURGERY — COLONOSCOPY
Anesthesia: Moderate Sedation

## 2020-01-18 MED ORDER — STERILE WATER FOR IRRIGATION IR SOLN
Status: DC | PRN
  Administered 2020-01-18: 1.5 mL

## 2020-01-18 MED ORDER — ONDANSETRON HCL 4 MG/2ML IJ SOLN
INTRAMUSCULAR | Status: AC
  Filled 2020-01-18: qty 2

## 2020-01-18 MED ORDER — MEPERIDINE HCL 100 MG/ML IJ SOLN
INTRAMUSCULAR | Status: DC | PRN
  Administered 2020-01-18: 15 mg via INTRAVENOUS
  Administered 2020-01-18: 25 mg via INTRAVENOUS

## 2020-01-18 MED ORDER — MIDAZOLAM HCL 5 MG/5ML IJ SOLN
INTRAMUSCULAR | Status: DC | PRN
  Administered 2020-01-18: 2 mg via INTRAVENOUS
  Administered 2020-01-18: 1 mg via INTRAVENOUS
  Administered 2020-01-18: 2 mg via INTRAVENOUS

## 2020-01-18 MED ORDER — ONDANSETRON HCL 4 MG/2ML IJ SOLN
INTRAMUSCULAR | Status: DC | PRN
  Administered 2020-01-18: 4 mg via INTRAVENOUS

## 2020-01-18 MED ORDER — SODIUM CHLORIDE 0.9 % IV SOLN: INTRAVENOUS | Status: DC

## 2020-01-18 MED ORDER — MEPERIDINE HCL 50 MG/ML IJ SOLN
INTRAMUSCULAR | Status: AC
  Filled 2020-01-18: qty 1

## 2020-01-18 MED ORDER — MIDAZOLAM HCL 5 MG/5ML IJ SOLN
INTRAMUSCULAR | Status: AC
  Filled 2020-01-18: qty 10

## 2020-01-18 NOTE — Interval H&P Note (Signed)
History and Physical Interval Note:  01/18/2020 8:35 AM  Brent Payne  has presented today for surgery, with the diagnosis of iron deficiency anemia.  The various methods of treatment have been discussed with the patient and family. After consideration of risks, benefits and other options for treatment, the patient has consented to  Procedure(s) with comments: COLONOSCOPY (N/A) - 9:15am as a surgical intervention.  The patient's history has been reviewed, patient examined, no change in status, stable for surgery.  I have reviewed the patient's chart and labs.  Questions were answered to the patient's satisfaction.     Brent Payne   No change.  Patient denies melena or hematochezia or change in bowel habits.  Diagnostic colonoscopy per plan for IDA. The risks, benefits, limitations, alternatives and imponderables have been reviewed with the patient. Questions have been answered. All parties are agreeable.

## 2020-01-18 NOTE — Discharge Instructions (Signed)
Colonoscopy Discharge Instructions  Read the instructions outlined below and refer to this sheet in the next few weeks. These discharge instructions provide you with general information on caring for yourself after you leave the hospital. Your doctor may also give you specific instructions. While your treatment has been planned according to the most current medical practices available, unavoidable complications occasionally occur. If you have any problems or questions after discharge, call Dr. Gala Romney at [phone]. ACTIVITY  You may resume your regular activity, but move at a slower pace for the next 24 hours.   Take frequent rest periods for the next 24 hours.   Walking will help get rid of the air and reduce the bloated feeling in your belly (abdomen).   No driving for 24 hours (because of the medicine (anesthesia) used during the test).    Do not sign any important legal documents or operate any machinery for 24 hours (because of the anesthesia used during the test).  NUTRITION  Drink plenty of fluids.   You may resume your normal diet as instructed by your doctor.   Begin with a light meal and progress to your normal diet. Heavy or fried foods are harder to digest and may make you feel sick to your stomach (nauseated).   Avoid alcoholic beverages for 24 hours or as instructed.  MEDICATIONS  You may resume your normal medications unless your doctor tells you otherwise.  WHAT YOU CAN EXPECT TODAY  Some feelings of bloating in the abdomen.   Passage of more gas than usual.   Spotting of blood in your stool or on the toilet paper.  IF YOU HAD POLYPS REMOVED DURING THE COLONOSCOPY:  No aspirin products for 7 days or as instructed.   No alcohol for 7 days or as instructed.   Eat a soft diet for the next 24 hours.  FINDING OUT THE RESULTS OF YOUR TEST Not all test results are available during your visit. If your test results are not back during the visit, make an appointment  with your caregiver to find out the results. Do not assume everything is normal if you have not heard from your caregiver or the medical facility. It is important for you to follow up on all of your test results.  SEEK IMMEDIATE MEDICAL ATTENTION IF:  You have more than a spotting of blood in your stool.   Your belly is swollen (abdominal distention).   You are nauseated or vomiting.   You have a temperature over 101.   You have abdominal pain or discomfort that is severe or gets worse throughout the day.    1 polyp removed from your colon today  Information on diverticulosis and polyps provided  Further recommendations to follow pending review of pathology report  At patient request, called Harlan Stains at [phone]-receptionist said she was not there   Colon Polyps  Polyps are tissue growths inside the body. Polyps can grow in many places, including the large intestine (colon). A polyp may be a round bump or a mushroom-shaped growth. You could have one polyp or several. Most colon polyps are noncancerous (benign). However, some colon polyps can become cancerous over time. Finding and removing the polyps early can help prevent this. What are the causes? The exact cause of colon polyps is not known. What increases the risk? You are more likely to develop this condition if you:  Have a family history of colon cancer or colon polyps.  Are older than 50 or older than  11 if you are African American.  Have inflammatory bowel disease, such as ulcerative colitis or Crohn's disease.  Have certain hereditary conditions, such as: ? Familial adenomatous polyposis. ? Lynch syndrome. ? Turcot syndrome. ? Peutz-Jeghers syndrome.  Are overweight.  Smoke cigarettes.  Do not get enough exercise.  Drink too much alcohol.  Eat a diet that is high in fat and red meat and low in fiber.  Had childhood cancer that was treated with abdominal radiation. What are the signs or  symptoms? Most polyps do not cause symptoms. If you have symptoms, they may include:  Blood coming from your rectum when having a bowel movement.  Blood in your stool. The stool may look dark red or black.  Abdominal pain.  A change in bowel habits, such as constipation or diarrhea. How is this diagnosed? This condition is diagnosed with a colonoscopy. This is a procedure in which a lighted, flexible scope is inserted into the anus and then passed into the colon to examine the area. Polyps are sometimes found when a colonoscopy is done as part of routine cancer screening tests. How is this treated? Treatment for this condition involves removing any polyps that are found. Most polyps can be removed during a colonoscopy. Those polyps will then be tested for cancer. Additional treatment may be needed depending on the results of testing. Follow these instructions at home: Lifestyle  Maintain a healthy weight, or lose weight if recommended by your health care provider.  Exercise every day or as told by your health care provider.  Do not use any products that contain nicotine or tobacco, such as cigarettes and e-cigarettes. If you need help quitting, ask your health care provider.  If you drink alcohol, limit how much you have: ? 0-1 drink a day for women. ? 0-2 drinks a day for men.  Be aware of how much alcohol is in your drink. In the [place] one drink equals one 12 oz bottle of beer (355 mL), one 5 oz glass of wine (148 mL), or one 1 oz shot of hard liquor (44 mL). Eating and drinking   Eat foods that are high in fiber, such as fruits, vegetables, and whole grains.  Eat foods that are high in calcium and vitamin D, such as milk, cheese, yogurt, eggs, liver, fish, and broccoli.  Limit foods that are high in fat, such as fried foods and desserts.  Limit the amount of red meat and processed meat you eat, such as hot dogs, sausage, bacon, and lunch meats. General instructions  Keep  all follow-up visits as told by your health care provider. This is important. ? This includes having regularly scheduled colonoscopies. ? Talk to your health care provider about when you need a colonoscopy. Contact a health care provider if:  You have new or worsening bleeding during a bowel movement.  You have new or increased blood in your stool.  You have a change in bowel habits.  You lose weight for no known reason. Summary  Polyps are tissue growths inside the body. Polyps can grow in many places, including the colon.  Most colon polyps are noncancerous (benign), but some can become cancerous over time.  This condition is diagnosed with a colonoscopy.  Treatment for this condition involves removing any polyps that are found. Most polyps can be removed during a colonoscopy. This information is not intended to replace advice given to you by your health care provider. Make sure you discuss any questions you have with  your health care provider. Document Revised: 04/23/2017 Document Reviewed: 04/23/2017 Elsevier Patient Education  [place].  Diverticulosis  Diverticulosis is a condition that develops when small pouches (diverticula) form in the wall of the large intestine (colon). The colon is where water is absorbed and stool (feces) is formed. The pouches form when the inside layer of the colon pushes through weak spots in the outer layers of the colon. You may have a few pouches or many of them. The pouches usually do not cause problems unless they become inflamed or infected. When this happens, the condition is called diverticulitis. What are the causes? The cause of this condition is not known. What increases the risk? The following factors may make you more likely to develop this condition:  Being older than age 71. Your risk for this condition increases with age. Diverticulosis is rare among people younger than age 48. By age 48, many people have it.  Eating a  low-fiber diet.  Having frequent constipation.  Being overweight.  Not getting enough exercise.  Smoking.  Taking over-the-counter pain medicines, like aspirin and ibuprofen.  Having a family history of diverticulosis. What are the signs or symptoms? In most people, there are no symptoms of this condition. If you do have symptoms, they may include:  Bloating.  Cramps in the abdomen.  Constipation or diarrhea.  Pain in the lower left side of the abdomen. How is this diagnosed? Because diverticulosis usually has no symptoms, it is most often diagnosed during an exam for other colon problems. The condition may be diagnosed by:  Using a flexible scope to examine the colon (colonoscopy).  Taking an X-ray of the colon after dye has been put into the colon (barium enema).  Having a CT scan. How is this treated? You may not need treatment for this condition. Your health care provider may recommend treatment to prevent problems. You may need treatment if you have symptoms or if you previously had diverticulitis. Treatment may include:  Eating a high-fiber diet.  Taking a fiber supplement.  Taking a live bacteria supplement (probiotic).  Taking medicine to relax your colon. Follow these instructions at home: Medicines  Take over-the-counter and prescription medicines only as told by your health care provider.  If told by your health care provider, take a fiber supplement or probiotic. Constipation prevention Your condition may cause constipation. To prevent or treat constipation, you may need to:  Drink enough fluid to keep your urine pale yellow.  Take over-the-counter or prescription medicines.  Eat foods that are high in fiber, such as beans, whole grains, and fresh fruits and vegetables.  Limit foods that are high in fat and processed sugars, such as fried or sweet foods.  General instructions  Try not to strain when you have a bowel movement.  Keep all  follow-up visits as told by your health care provider. This is important. Contact a health care provider if you:  Have pain in your abdomen.  Have bloating.  Have cramps.  Have not had a bowel movement in 3 days. Get help right away if:  Your pain gets worse.  Your bloating becomes very bad.  You have a fever or chills, and your symptoms suddenly get worse.  You vomit.  You have bowel movements that are bloody or black.  You have bleeding from your rectum. Summary  Diverticulosis is a condition that develops when small pouches (diverticula) form in the wall of the large intestine (colon).  You may have a  few pouches or many of them.  This condition is most often diagnosed during an exam for other colon problems.  Treatment may include increasing the fiber in your diet, taking supplements, or taking medicines. This information is not intended to replace advice given to you by your health care provider. Make sure you discuss any questions you have with your health care provider. Document Revised: 08/05/2018 Document Reviewed: 08/05/2018 Elsevier Patient Education  [place].

## 2020-01-18 NOTE — Op Note (Signed)
[facility] Patient Name: Brent Payne Procedure Date: 01/18/2020 8:07 AM MRN: [id number] Date of Birth: 24-Oct-1945 Attending MD: Norvel Richards , MD CSN: [id number] Age: 74 Admit Type: Outpatient Procedure:                Colonoscopy Indications:              Iron deficiency anemia Providers:                Norvel Richards, MD, Charlsie Quest. Insurance claims handler, Therapist, sports,                            Suzan Garibaldi. Risa Grill, Technician, Wynonia Musty                            Tech, Merchant [employer] officer Referring MD:              Medicines:                Midazolam 5 mg IV, Meperidine 40 mg IV Complications:            No immediate complications. Estimated Blood Loss:     Estimated blood loss was minimal. Procedure:                Pre-Anesthesia Assessment:                           - Prior to the procedure, a History and Physical                            was performed, and patient medications and                            allergies were reviewed. The patient's tolerance of                            previous anesthesia was also reviewed. The risks                            and benefits of the procedure and the sedation                            options and risks were discussed with the patient.                            All questions were answered, and informed consent                            was obtained. Prior Anticoagulants: The patient has                            taken no previous anticoagulant or antiplatelet                            agents. ASA Grade Assessment: II - A patient with  mild systemic disease. After reviewing the risks                            and benefits, the patient was deemed in                            satisfactory condition to undergo the procedure.                           After obtaining informed consent, the colonoscope                            was passed under direct vision. Throughout the                             procedure, the patient's blood pressure, pulse, and                            oxygen saturations were monitored continuously. The                            CF-[id number] ([id number]) scope was introduced through                            the anus and advanced to the the cecum, identified                            by appendiceal orifice and ileocecal valve. The                            colonoscopy was performed without difficulty. The                            patient tolerated the procedure well. The quality                            of the bowel preparation was adequate. Scope In: 9:18:44 AM Scope Out: 9:35:42 AM Scope Withdrawal Time: 0 hours 8 minutes 53 seconds  Total Procedure Duration: 0 hours 16 minutes 58 seconds  Findings:      The perianal and digital rectal examinations were normal.      Scattered medium-mouthed diverticula were found in the sigmoid colon and       descending colon.      A 3 mm polyp was found in the cecum. The polyp was sessile. The polyp       was removed with a cold snare. Resection and retrieval were complete.       Estimated blood loss was minimal.      The exam was otherwise without abnormality on direct and retroflexion       views. Impression:               - Diverticulosis in the sigmoid colon and in the                            descending colon.                           -  One 3 mm polyp in the cecum, removed with a cold                            snare. Resected and retrieved.                           - The examination was otherwise normal on direct                            and retroflexion views. An explanation for iron                            deficiency anemia not found on today's examination. Moderate Sedation:      Moderate (conscious) sedation was administered by the endoscopy nurse       and supervised by the endoscopist. The following parameters were       monitored: oxygen saturation, heart rate, blood pressure, respiratory        rate, EKG, adequacy of pulmonary ventilation, and response to care.       Total physician intraservice time was 20 minutes. Recommendation:           - Patient has a contact number available for                            emergencies. The signs and symptoms of potential                            delayed complications were discussed with the                            patient. Return to normal activities tomorrow.                            Written discharge instructions were provided to the                            patient.                           - Advance diet as tolerated.                           - Continue present medications.                           - Repeat colonoscopy date to be determined after                            pending pathology results are reviewed for                            surveillance.                           - Return to GI office (date not yet determined). Procedure Code(s):        ---  Professional ---                           [phone], Colonoscopy, flexible; with removal of                            tumor(s), polyp(s), or other lesion(s) by snare                            technique                           G0500, Moderate sedation services provided by the                            same physician or other qualified health care                            professional performing a gastrointestinal                            endoscopic service that sedation supports,                            requiring the presence of an independent trained                            observer to assist in the monitoring of the                            patient's level of consciousness and physiological                            status; initial 15 minutes of intra-service time;                            patient age 60 years or older (additional time may                            be reported with [phone], as appropriate) Diagnosis Code(s):        --- Professional ---                            K63.5, Polyp of colon                           D50.9, Iron deficiency anemia, unspecified                           K57.30, Diverticulosis of large intestine without                            perforation or abscess without bleeding CPT copyright 2019 American Medical Association. All rights reserved. The codes documented in this report are preliminary and upon coder review may  be revised to meet current compliance requirements. Herbie Baltimore  Hilton Cork, MD Norvel Richards, MD 01/18/2020 9:40:05 AM This report has been signed electronically. Number of Addenda: 0

## 2020-01-19 ENCOUNTER — Encounter: Payer: Self-pay | Admitting: Internal Medicine

## 2020-01-19 LAB — SURGICAL PATHOLOGY

## 2020-01-24 ENCOUNTER — Encounter (HOSPITAL_COMMUNITY): Payer: Self-pay | Admitting: Internal Medicine

## 2020-01-27 DIAGNOSIS — I10 Essential (primary) hypertension: Secondary | ICD-10-CM | POA: Diagnosis not present

## 2020-01-27 DIAGNOSIS — M179 Osteoarthritis of knee, unspecified: Secondary | ICD-10-CM | POA: Diagnosis not present

## 2020-02-27 DIAGNOSIS — I10 Essential (primary) hypertension: Secondary | ICD-10-CM | POA: Diagnosis not present

## 2020-02-27 DIAGNOSIS — M179 Osteoarthritis of knee, unspecified: Secondary | ICD-10-CM | POA: Diagnosis not present

## 2020-03-27 DIAGNOSIS — N1832 Chronic kidney disease, stage 3b: Secondary | ICD-10-CM | POA: Diagnosis not present

## 2020-03-27 DIAGNOSIS — I129 Hypertensive chronic kidney disease with stage 1 through stage 4 chronic kidney disease, or unspecified chronic kidney disease: Secondary | ICD-10-CM | POA: Diagnosis not present

## 2020-03-27 DIAGNOSIS — D638 Anemia in other chronic diseases classified elsewhere: Secondary | ICD-10-CM | POA: Diagnosis not present

## 2020-03-28 DIAGNOSIS — K219 Gastro-esophageal reflux disease without esophagitis: Secondary | ICD-10-CM | POA: Diagnosis not present

## 2020-03-28 DIAGNOSIS — Z79899 Other long term (current) drug therapy: Secondary | ICD-10-CM | POA: Diagnosis not present

## 2020-03-28 DIAGNOSIS — Z1389 Encounter for screening for other disorder: Secondary | ICD-10-CM | POA: Diagnosis not present

## 2020-03-28 DIAGNOSIS — I10 Essential (primary) hypertension: Secondary | ICD-10-CM | POA: Diagnosis not present

## 2020-03-28 DIAGNOSIS — Z0001 Encounter for general adult medical examination with abnormal findings: Secondary | ICD-10-CM | POA: Diagnosis not present

## 2020-03-28 DIAGNOSIS — N1831 Chronic kidney disease, stage 3a: Secondary | ICD-10-CM | POA: Diagnosis not present

## 2020-04-04 DIAGNOSIS — D638 Anemia in other chronic diseases classified elsewhere: Secondary | ICD-10-CM | POA: Diagnosis not present

## 2020-04-04 DIAGNOSIS — N1831 Chronic kidney disease, stage 3a: Secondary | ICD-10-CM | POA: Diagnosis not present

## 2020-04-04 DIAGNOSIS — I129 Hypertensive chronic kidney disease with stage 1 through stage 4 chronic kidney disease, or unspecified chronic kidney disease: Secondary | ICD-10-CM | POA: Diagnosis not present

## 2020-04-04 DIAGNOSIS — D508 Other iron deficiency anemias: Secondary | ICD-10-CM | POA: Diagnosis not present

## 2020-04-28 DIAGNOSIS — I10 Essential (primary) hypertension: Secondary | ICD-10-CM | POA: Diagnosis not present

## 2020-04-28 DIAGNOSIS — M179 Osteoarthritis of knee, unspecified: Secondary | ICD-10-CM | POA: Diagnosis not present

## 2020-05-28 DIAGNOSIS — M179 Osteoarthritis of knee, unspecified: Secondary | ICD-10-CM | POA: Diagnosis not present

## 2020-05-28 DIAGNOSIS — I10 Essential (primary) hypertension: Secondary | ICD-10-CM | POA: Diagnosis not present

## 2020-06-28 DIAGNOSIS — M179 Osteoarthritis of knee, unspecified: Secondary | ICD-10-CM | POA: Diagnosis not present

## 2020-06-28 DIAGNOSIS — I10 Essential (primary) hypertension: Secondary | ICD-10-CM | POA: Diagnosis not present

## 2020-06-28 DIAGNOSIS — D508 Other iron deficiency anemias: Secondary | ICD-10-CM | POA: Diagnosis not present

## 2020-06-28 DIAGNOSIS — I129 Hypertensive chronic kidney disease with stage 1 through stage 4 chronic kidney disease, or unspecified chronic kidney disease: Secondary | ICD-10-CM | POA: Diagnosis not present

## 2020-06-28 DIAGNOSIS — N1831 Chronic kidney disease, stage 3a: Secondary | ICD-10-CM | POA: Diagnosis not present

## 2020-06-28 DIAGNOSIS — D638 Anemia in other chronic diseases classified elsewhere: Secondary | ICD-10-CM | POA: Diagnosis not present

## 2020-07-17 DIAGNOSIS — M2141 Flat foot [pes planus] (acquired), right foot: Secondary | ICD-10-CM | POA: Diagnosis not present

## 2020-07-17 DIAGNOSIS — M76821 Posterior tibial tendinitis, right leg: Secondary | ICD-10-CM | POA: Diagnosis not present

## 2020-07-18 DIAGNOSIS — D638 Anemia in other chronic diseases classified elsewhere: Secondary | ICD-10-CM | POA: Diagnosis not present

## 2020-07-18 DIAGNOSIS — I129 Hypertensive chronic kidney disease with stage 1 through stage 4 chronic kidney disease, or unspecified chronic kidney disease: Secondary | ICD-10-CM | POA: Diagnosis not present

## 2020-07-18 DIAGNOSIS — N1831 Chronic kidney disease, stage 3a: Secondary | ICD-10-CM | POA: Diagnosis not present

## 2020-07-28 DIAGNOSIS — I10 Essential (primary) hypertension: Secondary | ICD-10-CM | POA: Diagnosis not present

## 2020-07-28 DIAGNOSIS — M179 Osteoarthritis of knee, unspecified: Secondary | ICD-10-CM | POA: Diagnosis not present

## 2020-08-28 DIAGNOSIS — I10 Essential (primary) hypertension: Secondary | ICD-10-CM | POA: Diagnosis not present

## 2020-08-28 DIAGNOSIS — M179 Osteoarthritis of knee, unspecified: Secondary | ICD-10-CM | POA: Diagnosis not present

## 2020-09-25 DIAGNOSIS — D638 Anemia in other chronic diseases classified elsewhere: Secondary | ICD-10-CM | POA: Diagnosis not present

## 2020-09-25 DIAGNOSIS — N1831 Chronic kidney disease, stage 3a: Secondary | ICD-10-CM | POA: Diagnosis not present

## 2020-09-25 DIAGNOSIS — I10 Essential (primary) hypertension: Secondary | ICD-10-CM | POA: Diagnosis not present

## 2020-09-25 DIAGNOSIS — I129 Hypertensive chronic kidney disease with stage 1 through stage 4 chronic kidney disease, or unspecified chronic kidney disease: Secondary | ICD-10-CM | POA: Diagnosis not present

## 2020-09-25 DIAGNOSIS — N1832 Chronic kidney disease, stage 3b: Secondary | ICD-10-CM | POA: Diagnosis not present

## 2020-09-25 DIAGNOSIS — M179 Osteoarthritis of knee, unspecified: Secondary | ICD-10-CM | POA: Diagnosis not present

## 2020-09-25 DIAGNOSIS — R7303 Prediabetes: Secondary | ICD-10-CM | POA: Diagnosis not present

## 2020-10-24 DIAGNOSIS — M79671 Pain in right foot: Secondary | ICD-10-CM | POA: Diagnosis not present

## 2020-10-24 DIAGNOSIS — G5750 Tarsal tunnel syndrome, unspecified lower limb: Secondary | ICD-10-CM | POA: Diagnosis not present

## 2020-10-28 DIAGNOSIS — M179 Osteoarthritis of knee, unspecified: Secondary | ICD-10-CM | POA: Diagnosis not present

## 2020-10-28 DIAGNOSIS — I10 Essential (primary) hypertension: Secondary | ICD-10-CM | POA: Diagnosis not present

## 2020-10-29 DIAGNOSIS — I129 Hypertensive chronic kidney disease with stage 1 through stage 4 chronic kidney disease, or unspecified chronic kidney disease: Secondary | ICD-10-CM | POA: Diagnosis not present

## 2020-10-29 DIAGNOSIS — N1831 Chronic kidney disease, stage 3a: Secondary | ICD-10-CM | POA: Diagnosis not present

## 2020-10-29 DIAGNOSIS — D638 Anemia in other chronic diseases classified elsewhere: Secondary | ICD-10-CM | POA: Diagnosis not present

## 2020-11-21 DIAGNOSIS — M79672 Pain in left foot: Secondary | ICD-10-CM | POA: Diagnosis not present

## 2020-11-21 DIAGNOSIS — G5750 Tarsal tunnel syndrome, unspecified lower limb: Secondary | ICD-10-CM | POA: Diagnosis not present

## 2020-11-21 DIAGNOSIS — M79671 Pain in right foot: Secondary | ICD-10-CM | POA: Diagnosis not present

## 2020-11-28 DIAGNOSIS — M179 Osteoarthritis of knee, unspecified: Secondary | ICD-10-CM | POA: Diagnosis not present

## 2020-11-28 DIAGNOSIS — I10 Essential (primary) hypertension: Secondary | ICD-10-CM | POA: Diagnosis not present

## 2020-12-12 DIAGNOSIS — M79671 Pain in right foot: Secondary | ICD-10-CM | POA: Diagnosis not present

## 2020-12-12 DIAGNOSIS — G5750 Tarsal tunnel syndrome, unspecified lower limb: Secondary | ICD-10-CM | POA: Diagnosis not present

## 2020-12-28 DIAGNOSIS — I10 Essential (primary) hypertension: Secondary | ICD-10-CM | POA: Diagnosis not present

## 2020-12-28 DIAGNOSIS — Z96659 Presence of unspecified artificial knee joint: Secondary | ICD-10-CM | POA: Diagnosis not present

## 2021-01-02 DIAGNOSIS — M79671 Pain in right foot: Secondary | ICD-10-CM | POA: Diagnosis not present

## 2021-01-02 DIAGNOSIS — Z23 Encounter for immunization: Secondary | ICD-10-CM | POA: Diagnosis not present

## 2021-01-02 DIAGNOSIS — G5750 Tarsal tunnel syndrome, unspecified lower limb: Secondary | ICD-10-CM | POA: Diagnosis not present

## 2021-01-23 DIAGNOSIS — I129 Hypertensive chronic kidney disease with stage 1 through stage 4 chronic kidney disease, or unspecified chronic kidney disease: Secondary | ICD-10-CM | POA: Diagnosis not present

## 2021-01-23 DIAGNOSIS — M79671 Pain in right foot: Secondary | ICD-10-CM | POA: Diagnosis not present

## 2021-01-23 DIAGNOSIS — D638 Anemia in other chronic diseases classified elsewhere: Secondary | ICD-10-CM | POA: Diagnosis not present

## 2021-01-23 DIAGNOSIS — M1990 Unspecified osteoarthritis, unspecified site: Secondary | ICD-10-CM | POA: Diagnosis not present

## 2021-01-23 DIAGNOSIS — N1831 Chronic kidney disease, stage 3a: Secondary | ICD-10-CM | POA: Diagnosis not present

## 2021-01-28 DIAGNOSIS — I10 Essential (primary) hypertension: Secondary | ICD-10-CM | POA: Diagnosis not present

## 2021-01-28 DIAGNOSIS — M179 Osteoarthritis of knee, unspecified: Secondary | ICD-10-CM | POA: Diagnosis not present

## 2021-02-07 DIAGNOSIS — D638 Anemia in other chronic diseases classified elsewhere: Secondary | ICD-10-CM | POA: Diagnosis not present

## 2021-02-07 DIAGNOSIS — N1831 Chronic kidney disease, stage 3a: Secondary | ICD-10-CM | POA: Diagnosis not present

## 2021-02-07 DIAGNOSIS — I129 Hypertensive chronic kidney disease with stage 1 through stage 4 chronic kidney disease, or unspecified chronic kidney disease: Secondary | ICD-10-CM | POA: Diagnosis not present

## 2021-02-28 DIAGNOSIS — M179 Osteoarthritis of knee, unspecified: Secondary | ICD-10-CM | POA: Diagnosis not present

## 2021-02-28 DIAGNOSIS — I10 Essential (primary) hypertension: Secondary | ICD-10-CM | POA: Diagnosis not present

## 2021-03-12 DIAGNOSIS — S86312D Strain of muscle(s) and tendon(s) of peroneal muscle group at lower leg level, left leg, subsequent encounter: Secondary | ICD-10-CM | POA: Diagnosis not present

## 2021-03-21 DIAGNOSIS — M19071 Primary osteoarthritis, right ankle and foot: Secondary | ICD-10-CM | POA: Diagnosis not present

## 2021-03-21 DIAGNOSIS — S96911A Strain of unspecified muscle and tendon at ankle and foot level, right foot, initial encounter: Secondary | ICD-10-CM | POA: Diagnosis not present

## 2021-03-21 DIAGNOSIS — M7731 Calcaneal spur, right foot: Secondary | ICD-10-CM | POA: Diagnosis not present

## 2021-04-03 DIAGNOSIS — M25571 Pain in right ankle and joints of right foot: Secondary | ICD-10-CM | POA: Diagnosis not present

## 2021-04-05 ENCOUNTER — Other Ambulatory Visit (HOSPITAL_COMMUNITY)
Admission: RE | Admit: 2021-04-05 | Discharge: 2021-04-05 | Disposition: A | Discharge status: Home / Self Care | Payer: Medicare Other | Source: Ambulatory Visit | Attending: Internal Medicine | Admitting: Internal Medicine

## 2021-04-05 DIAGNOSIS — K219 Gastro-esophageal reflux disease without esophagitis: Secondary | ICD-10-CM | POA: Insufficient documentation

## 2021-04-05 DIAGNOSIS — Z1389 Encounter for screening for other disorder: Secondary | ICD-10-CM | POA: Diagnosis not present

## 2021-04-05 DIAGNOSIS — R7303 Prediabetes: Secondary | ICD-10-CM | POA: Insufficient documentation

## 2021-04-05 DIAGNOSIS — I129 Hypertensive chronic kidney disease with stage 1 through stage 4 chronic kidney disease, or unspecified chronic kidney disease: Secondary | ICD-10-CM | POA: Diagnosis not present

## 2021-04-05 DIAGNOSIS — N1831 Chronic kidney disease, stage 3a: Secondary | ICD-10-CM | POA: Diagnosis not present

## 2021-04-05 DIAGNOSIS — Z0001 Encounter for general adult medical examination with abnormal findings: Secondary | ICD-10-CM | POA: Insufficient documentation

## 2021-04-05 DIAGNOSIS — I10 Essential (primary) hypertension: Secondary | ICD-10-CM | POA: Diagnosis not present

## 2021-04-05 DIAGNOSIS — R739 Hyperglycemia, unspecified: Secondary | ICD-10-CM | POA: Diagnosis not present

## 2021-04-05 LAB — CBC WITH DIFFERENTIAL/PLATELET
Abs Immature Granulocytes: 0.03 10*3/uL (ref 0.00–0.07)
Basophils Absolute: 0.1 10*3/uL (ref 0.0–0.1)
Basophils Relative: 1 %
Eosinophils Absolute: 0.2 10*3/uL (ref 0.0–0.5)
Eosinophils Relative: 4 %
HCT: 36.2 % — ABNORMAL LOW (ref 39.0–52.0)
Hemoglobin: 11.9 g/dL — ABNORMAL LOW (ref 13.0–17.0)
Immature Granulocytes: 1 %
Lymphocytes Relative: 32 %
Lymphs Abs: 1.7 10*3/uL (ref 0.7–4.0)
MCH: 30.4 pg (ref 26.0–34.0)
MCHC: 32.9 g/dL (ref 30.0–36.0)
MCV: 92.3 fL (ref 80.0–100.0)
Monocytes Absolute: 0.4 10*3/uL (ref 0.1–1.0)
Monocytes Relative: 7 %
Neutro Abs: 3 10*3/uL (ref 1.7–7.7)
Neutrophils Relative %: 55 %
Platelets: 242 10*3/uL (ref 150–400)
RBC: 3.92 MIL/uL — ABNORMAL LOW (ref 4.22–5.81)
RDW: 12.7 % (ref 11.5–15.5)
WBC: 5.5 10*3/uL (ref 4.0–10.5)
nRBC: 0 % (ref 0.0–0.2)

## 2021-04-05 LAB — BASIC METABOLIC PANEL
Anion gap: 8 (ref 5–15)
BUN: 18 mg/dL (ref 8–23)
CO2: 29 mmol/L (ref 22–32)
Calcium: 9.2 mg/dL (ref 8.9–10.3)
Chloride: 105 mmol/L (ref 98–111)
Creatinine, Ser: 1.45 mg/dL — ABNORMAL HIGH (ref 0.61–1.24)
GFR, Estimated: 50 mL/min — ABNORMAL LOW (ref >60)
Glucose, Bld: 107 mg/dL — ABNORMAL HIGH (ref 70–99)
Potassium: 3.7 mmol/L (ref 3.5–5.1)
Sodium: 142 mmol/L (ref 135–145)

## 2021-04-05 LAB — HEPATIC FUNCTION PANEL
ALT: 20 U/L (ref 0–44)
AST: 18 U/L (ref 15–41)
Albumin: 4.1 g/dL (ref 3.5–5.0)
Alkaline Phosphatase: 61 U/L (ref 38–126)
Bilirubin, Direct: <0.1 mg/dL (ref 0.0–0.2)
Total Bilirubin: 0.6 mg/dL (ref 0.3–1.2)
Total Protein: 7.8 g/dL (ref 6.5–8.1)

## 2021-04-05 LAB — LIPID PANEL
Cholesterol: 204 mg/dL — ABNORMAL HIGH (ref 0–200)
HDL: 38 mg/dL — ABNORMAL LOW (ref >40)
LDL Cholesterol: 121 mg/dL — ABNORMAL HIGH (ref 0–99)
Total CHOL/HDL Ratio: 5.4 RATIO
Triglycerides: 224 mg/dL — ABNORMAL HIGH (ref <150)
VLDL: 45 mg/dL — ABNORMAL HIGH (ref 0–40)

## 2021-04-06 LAB — HEMOGLOBIN A1C
Hgb A1c MFr Bld: 5.8 % — ABNORMAL HIGH (ref 4.8–5.6)
Mean Plasma Glucose: 119.76 mg/dL

## 2021-05-06 DIAGNOSIS — M179 Osteoarthritis of knee, unspecified: Secondary | ICD-10-CM | POA: Diagnosis not present

## 2021-05-06 DIAGNOSIS — I10 Essential (primary) hypertension: Secondary | ICD-10-CM | POA: Diagnosis not present

## 2021-05-09 DIAGNOSIS — I129 Hypertensive chronic kidney disease with stage 1 through stage 4 chronic kidney disease, or unspecified chronic kidney disease: Secondary | ICD-10-CM | POA: Diagnosis not present

## 2021-05-09 DIAGNOSIS — D638 Anemia in other chronic diseases classified elsewhere: Secondary | ICD-10-CM | POA: Diagnosis not present

## 2021-05-09 DIAGNOSIS — N1831 Chronic kidney disease, stage 3a: Secondary | ICD-10-CM | POA: Diagnosis not present

## 2021-05-09 DIAGNOSIS — R7303 Prediabetes: Secondary | ICD-10-CM | POA: Diagnosis not present

## 2021-06-05 DIAGNOSIS — M179 Osteoarthritis of knee, unspecified: Secondary | ICD-10-CM | POA: Diagnosis not present

## 2021-06-05 DIAGNOSIS — I10 Essential (primary) hypertension: Secondary | ICD-10-CM | POA: Diagnosis not present

## 2021-06-07 DIAGNOSIS — Z5321 Procedure and treatment not carried out due to patient leaving prior to being seen by health care provider: Secondary | ICD-10-CM | POA: Diagnosis not present

## 2021-06-07 DIAGNOSIS — R112 Nausea with vomiting, unspecified: Secondary | ICD-10-CM | POA: Diagnosis not present

## 2021-06-07 DIAGNOSIS — R109 Unspecified abdominal pain: Secondary | ICD-10-CM | POA: Diagnosis not present

## 2021-06-08 DIAGNOSIS — R112 Nausea with vomiting, unspecified: Secondary | ICD-10-CM | POA: Diagnosis not present

## 2021-06-08 DIAGNOSIS — K529 Noninfective gastroenteritis and colitis, unspecified: Secondary | ICD-10-CM | POA: Diagnosis not present

## 2021-06-08 DIAGNOSIS — Z5321 Procedure and treatment not carried out due to patient leaving prior to being seen by health care provider: Secondary | ICD-10-CM | POA: Diagnosis not present

## 2021-06-08 DIAGNOSIS — R109 Unspecified abdominal pain: Secondary | ICD-10-CM | POA: Diagnosis not present

## 2021-07-06 DIAGNOSIS — I10 Essential (primary) hypertension: Secondary | ICD-10-CM | POA: Diagnosis not present

## 2021-07-30 DIAGNOSIS — M25571 Pain in right ankle and joints of right foot: Secondary | ICD-10-CM | POA: Diagnosis not present

## 2021-07-30 DIAGNOSIS — S86311D Strain of muscle(s) and tendon(s) of peroneal muscle group at lower leg level, right leg, subsequent encounter: Secondary | ICD-10-CM | POA: Diagnosis not present

## 2021-08-01 DIAGNOSIS — D638 Anemia in other chronic diseases classified elsewhere: Secondary | ICD-10-CM | POA: Diagnosis not present

## 2021-08-01 DIAGNOSIS — N1831 Chronic kidney disease, stage 3a: Secondary | ICD-10-CM | POA: Diagnosis not present

## 2021-08-01 DIAGNOSIS — I129 Hypertensive chronic kidney disease with stage 1 through stage 4 chronic kidney disease, or unspecified chronic kidney disease: Secondary | ICD-10-CM | POA: Diagnosis not present

## 2021-08-05 DIAGNOSIS — M179 Osteoarthritis of knee, unspecified: Secondary | ICD-10-CM | POA: Diagnosis not present

## 2021-08-05 DIAGNOSIS — I10 Essential (primary) hypertension: Secondary | ICD-10-CM | POA: Diagnosis not present

## 2021-08-08 DIAGNOSIS — I708 Atherosclerosis of other arteries: Secondary | ICD-10-CM | POA: Diagnosis not present

## 2021-08-08 DIAGNOSIS — D638 Anemia in other chronic diseases classified elsewhere: Secondary | ICD-10-CM | POA: Diagnosis not present

## 2021-08-08 DIAGNOSIS — N170 Acute kidney failure with tubular necrosis: Secondary | ICD-10-CM | POA: Diagnosis not present

## 2021-08-08 DIAGNOSIS — N1832 Chronic kidney disease, stage 3b: Secondary | ICD-10-CM | POA: Diagnosis not present

## 2021-08-08 DIAGNOSIS — I129 Hypertensive chronic kidney disease with stage 1 through stage 4 chronic kidney disease, or unspecified chronic kidney disease: Secondary | ICD-10-CM | POA: Diagnosis not present

## 2021-09-05 DIAGNOSIS — N184 Chronic kidney disease, stage 4 (severe): Secondary | ICD-10-CM | POA: Diagnosis not present

## 2021-09-05 DIAGNOSIS — E782 Mixed hyperlipidemia: Secondary | ICD-10-CM | POA: Diagnosis not present

## 2021-10-03 DIAGNOSIS — L0231 Cutaneous abscess of buttock: Secondary | ICD-10-CM | POA: Diagnosis not present

## 2021-10-03 DIAGNOSIS — R3915 Urgency of urination: Secondary | ICD-10-CM | POA: Diagnosis not present

## 2021-10-03 DIAGNOSIS — K6289 Other specified diseases of anus and rectum: Secondary | ICD-10-CM | POA: Diagnosis not present

## 2021-10-03 DIAGNOSIS — I10 Essential (primary) hypertension: Secondary | ICD-10-CM | POA: Diagnosis not present

## 2021-10-06 DIAGNOSIS — I10 Essential (primary) hypertension: Secondary | ICD-10-CM | POA: Diagnosis not present

## 2021-10-06 DIAGNOSIS — K219 Gastro-esophageal reflux disease without esophagitis: Secondary | ICD-10-CM | POA: Diagnosis not present

## 2021-11-07 DIAGNOSIS — N1832 Chronic kidney disease, stage 3b: Secondary | ICD-10-CM | POA: Diagnosis not present

## 2021-11-07 DIAGNOSIS — I708 Atherosclerosis of other arteries: Secondary | ICD-10-CM | POA: Diagnosis not present

## 2021-11-07 DIAGNOSIS — N170 Acute kidney failure with tubular necrosis: Secondary | ICD-10-CM | POA: Diagnosis not present

## 2021-11-07 DIAGNOSIS — I129 Hypertensive chronic kidney disease with stage 1 through stage 4 chronic kidney disease, or unspecified chronic kidney disease: Secondary | ICD-10-CM | POA: Diagnosis not present

## 2021-11-12 ENCOUNTER — Other Ambulatory Visit (HOSPITAL_COMMUNITY)
Admission: RE | Admit: 2021-11-12 | Discharge: 2021-11-12 | Disposition: A | Discharge status: Home / Self Care | Payer: Medicare Other | Source: Ambulatory Visit | Attending: Internal Medicine | Admitting: Internal Medicine

## 2021-11-12 ENCOUNTER — Ambulatory Visit (HOSPITAL_COMMUNITY)
Admission: RE | Admit: 2021-11-12 | Discharge: 2021-11-12 | Disposition: A | Discharge status: Home / Self Care | Payer: Medicare Other | Source: Ambulatory Visit | Attending: Gerontology | Admitting: Gerontology

## 2021-11-12 ENCOUNTER — Other Ambulatory Visit (HOSPITAL_COMMUNITY): Payer: Self-pay | Admitting: Gerontology

## 2021-11-12 DIAGNOSIS — Z0001 Encounter for general adult medical examination with abnormal findings: Secondary | ICD-10-CM | POA: Insufficient documentation

## 2021-11-12 DIAGNOSIS — I129 Hypertensive chronic kidney disease with stage 1 through stage 4 chronic kidney disease, or unspecified chronic kidney disease: Secondary | ICD-10-CM | POA: Insufficient documentation

## 2021-11-12 DIAGNOSIS — R059 Cough, unspecified: Secondary | ICD-10-CM | POA: Insufficient documentation

## 2021-11-12 DIAGNOSIS — R0989 Other specified symptoms and signs involving the circulatory and respiratory systems: Secondary | ICD-10-CM | POA: Insufficient documentation

## 2021-11-12 DIAGNOSIS — J479 Bronchiectasis, uncomplicated: Secondary | ICD-10-CM | POA: Diagnosis not present

## 2021-11-12 DIAGNOSIS — N1831 Chronic kidney disease, stage 3a: Secondary | ICD-10-CM | POA: Diagnosis not present

## 2021-11-12 DIAGNOSIS — J309 Allergic rhinitis, unspecified: Secondary | ICD-10-CM | POA: Diagnosis not present

## 2021-11-12 DIAGNOSIS — R7303 Prediabetes: Secondary | ICD-10-CM | POA: Insufficient documentation

## 2021-11-12 DIAGNOSIS — K219 Gastro-esophageal reflux disease without esophagitis: Secondary | ICD-10-CM | POA: Diagnosis not present

## 2021-11-12 DIAGNOSIS — Z23 Encounter for immunization: Secondary | ICD-10-CM | POA: Diagnosis not present

## 2021-11-12 DIAGNOSIS — I10 Essential (primary) hypertension: Secondary | ICD-10-CM | POA: Diagnosis not present

## 2021-11-12 LAB — LIPID PANEL
Cholesterol: 189 mg/dL (ref 0–200)
HDL: 35 mg/dL — ABNORMAL LOW (ref >40)
LDL Cholesterol: 126 mg/dL — ABNORMAL HIGH (ref 0–99)
Total CHOL/HDL Ratio: 5.4 RATIO
Triglycerides: 138 mg/dL (ref <150)
VLDL: 28 mg/dL (ref 0–40)

## 2021-11-12 LAB — CBC WITH DIFFERENTIAL/PLATELET
Abs Immature Granulocytes: 0.01 10*3/uL (ref 0.00–0.07)
Basophils Absolute: 0.1 10*3/uL (ref 0.0–0.1)
Basophils Relative: 1 %
Eosinophils Absolute: 0.2 10*3/uL (ref 0.0–0.5)
Eosinophils Relative: 4 %
HCT: 33.8 % — ABNORMAL LOW (ref 39.0–52.0)
Hemoglobin: 11.1 g/dL — ABNORMAL LOW (ref 13.0–17.0)
Immature Granulocytes: 0 %
Lymphocytes Relative: 26 %
Lymphs Abs: 1 10*3/uL (ref 0.7–4.0)
MCH: 30 pg (ref 26.0–34.0)
MCHC: 32.8 g/dL (ref 30.0–36.0)
MCV: 91.4 fL (ref 80.0–100.0)
Monocytes Absolute: 0.3 10*3/uL (ref 0.1–1.0)
Monocytes Relative: 7 %
Neutro Abs: 2.4 10*3/uL (ref 1.7–7.7)
Neutrophils Relative %: 62 %
Platelets: 219 10*3/uL (ref 150–400)
RBC: 3.7 MIL/uL — ABNORMAL LOW (ref 4.22–5.81)
RDW: 13 % (ref 11.5–15.5)
WBC: 3.8 10*3/uL — ABNORMAL LOW (ref 4.0–10.5)
nRBC: 0 % (ref 0.0–0.2)

## 2021-11-12 LAB — HEPATIC FUNCTION PANEL
ALT: 20 U/L (ref 0–44)
AST: 19 U/L (ref 15–41)
Albumin: 3.9 g/dL (ref 3.5–5.0)
Alkaline Phosphatase: 58 U/L (ref 38–126)
Bilirubin, Direct: <0.1 mg/dL (ref 0.0–0.2)
Total Bilirubin: 0.5 mg/dL (ref 0.3–1.2)
Total Protein: 7.5 g/dL (ref 6.5–8.1)

## 2021-11-12 LAB — BASIC METABOLIC PANEL
Anion gap: 6 (ref 5–15)
BUN: 17 mg/dL (ref 8–23)
CO2: 26 mmol/L (ref 22–32)
Calcium: 9.2 mg/dL (ref 8.9–10.3)
Chloride: 108 mmol/L (ref 98–111)
Creatinine, Ser: 1.4 mg/dL — ABNORMAL HIGH (ref 0.61–1.24)
GFR, Estimated: 51 mL/min — ABNORMAL LOW (ref >60)
Glucose, Bld: 104 mg/dL — ABNORMAL HIGH (ref 70–99)
Potassium: 3.6 mmol/L (ref 3.5–5.1)
Sodium: 140 mmol/L (ref 135–145)

## 2021-11-13 DIAGNOSIS — D638 Anemia in other chronic diseases classified elsewhere: Secondary | ICD-10-CM | POA: Diagnosis not present

## 2021-11-13 DIAGNOSIS — I129 Hypertensive chronic kidney disease with stage 1 through stage 4 chronic kidney disease, or unspecified chronic kidney disease: Secondary | ICD-10-CM | POA: Diagnosis not present

## 2021-11-13 DIAGNOSIS — I708 Atherosclerosis of other arteries: Secondary | ICD-10-CM | POA: Diagnosis not present

## 2021-11-13 DIAGNOSIS — N1831 Chronic kidney disease, stage 3a: Secondary | ICD-10-CM | POA: Diagnosis not present

## 2021-12-13 DIAGNOSIS — I10 Essential (primary) hypertension: Secondary | ICD-10-CM | POA: Diagnosis not present

## 2021-12-13 DIAGNOSIS — R7303 Prediabetes: Secondary | ICD-10-CM | POA: Diagnosis not present

## 2022-01-12 DIAGNOSIS — I10 Essential (primary) hypertension: Secondary | ICD-10-CM | POA: Diagnosis not present

## 2022-01-12 DIAGNOSIS — N1831 Chronic kidney disease, stage 3a: Secondary | ICD-10-CM | POA: Diagnosis not present

## 2022-02-06 DIAGNOSIS — I129 Hypertensive chronic kidney disease with stage 1 through stage 4 chronic kidney disease, or unspecified chronic kidney disease: Secondary | ICD-10-CM | POA: Diagnosis not present

## 2022-02-06 DIAGNOSIS — D638 Anemia in other chronic diseases classified elsewhere: Secondary | ICD-10-CM | POA: Diagnosis not present

## 2022-02-06 DIAGNOSIS — N1831 Chronic kidney disease, stage 3a: Secondary | ICD-10-CM | POA: Diagnosis not present

## 2022-02-06 DIAGNOSIS — I708 Atherosclerosis of other arteries: Secondary | ICD-10-CM | POA: Diagnosis not present

## 2022-02-12 DIAGNOSIS — I10 Essential (primary) hypertension: Secondary | ICD-10-CM | POA: Diagnosis not present

## 2022-02-12 DIAGNOSIS — N1831 Chronic kidney disease, stage 3a: Secondary | ICD-10-CM | POA: Diagnosis not present

## 2022-02-13 DIAGNOSIS — I708 Atherosclerosis of other arteries: Secondary | ICD-10-CM | POA: Diagnosis not present

## 2022-02-13 DIAGNOSIS — D638 Anemia in other chronic diseases classified elsewhere: Secondary | ICD-10-CM | POA: Diagnosis not present

## 2022-02-13 DIAGNOSIS — I129 Hypertensive chronic kidney disease with stage 1 through stage 4 chronic kidney disease, or unspecified chronic kidney disease: Secondary | ICD-10-CM | POA: Diagnosis not present

## 2022-02-13 DIAGNOSIS — N1831 Chronic kidney disease, stage 3a: Secondary | ICD-10-CM | POA: Diagnosis not present

## 2022-03-15 DIAGNOSIS — I10 Essential (primary) hypertension: Secondary | ICD-10-CM | POA: Diagnosis not present

## 2022-03-15 DIAGNOSIS — N1831 Chronic kidney disease, stage 3a: Secondary | ICD-10-CM | POA: Diagnosis not present

## 2022-03-19 IMAGING — US US RENAL
1 series · 14 of 25 positions shown · non-contrast
Comparison: Prior ultrasound from 09/30/2019.

CLINICAL DATA: Initial evaluation for acute renal failure with
tubular necrosis.

EXAM:
RENAL / URINARY TRACT ULTRASOUND COMPLETE

[Series 1: us renal · 14 of 48 slices shown]
[im 1/48]
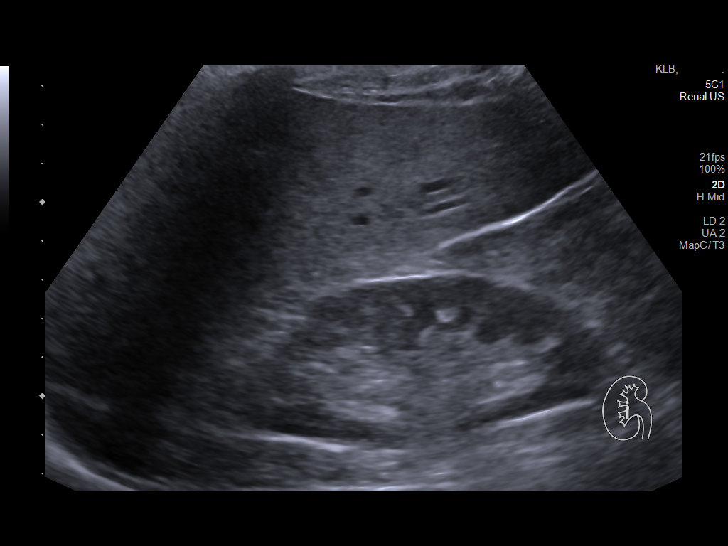
[im 4/48]
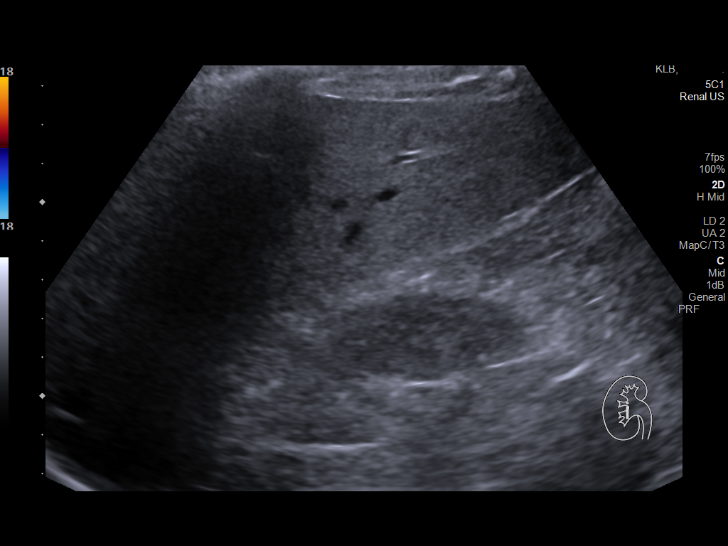
[im 8/48]
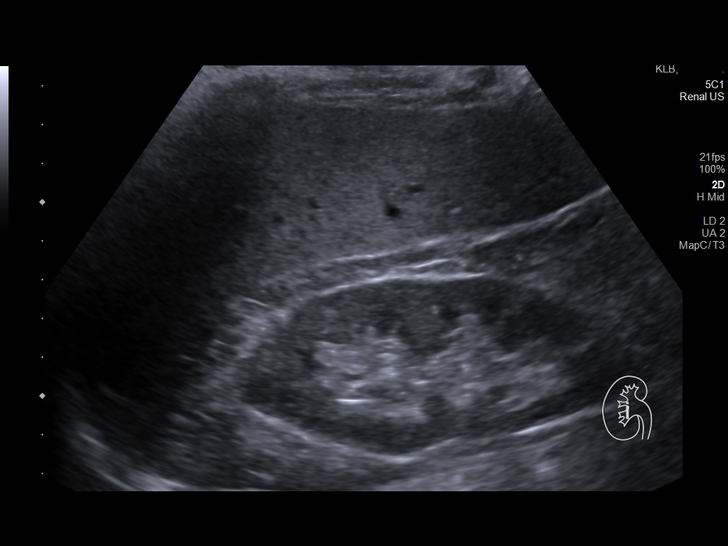
[im 12/48]
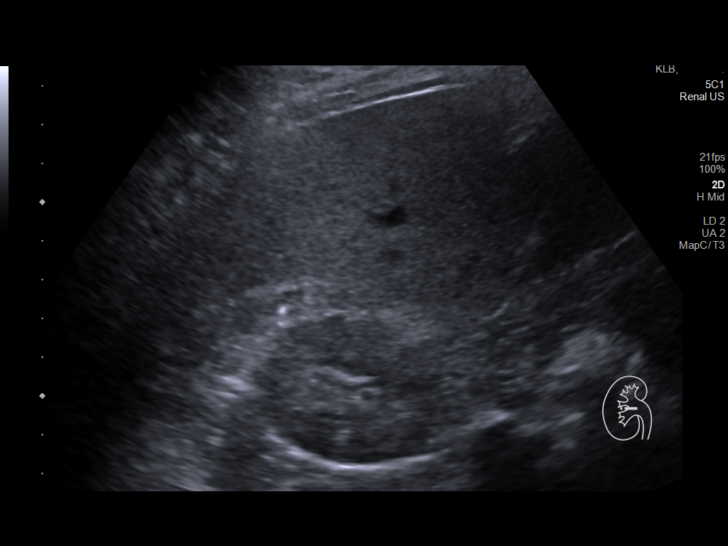
[im 16/48]
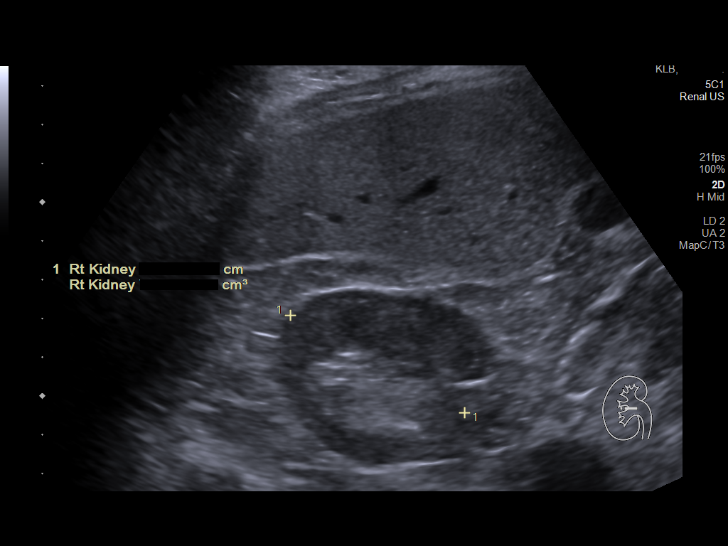
[im 18/48]
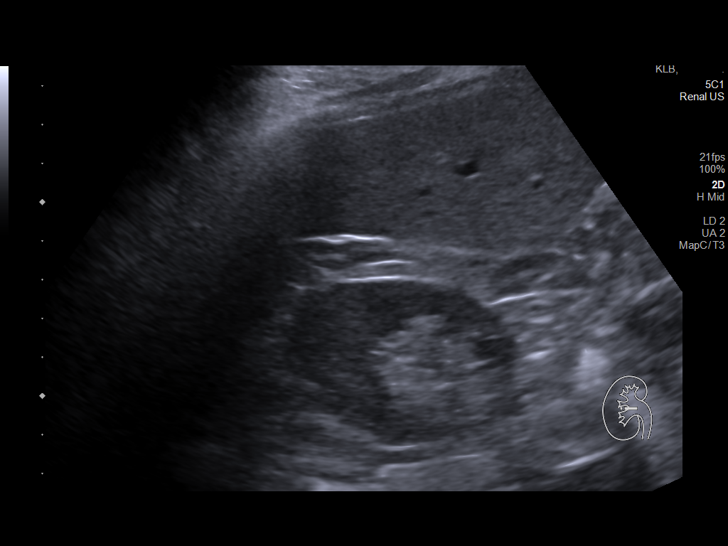
[im 22/48]
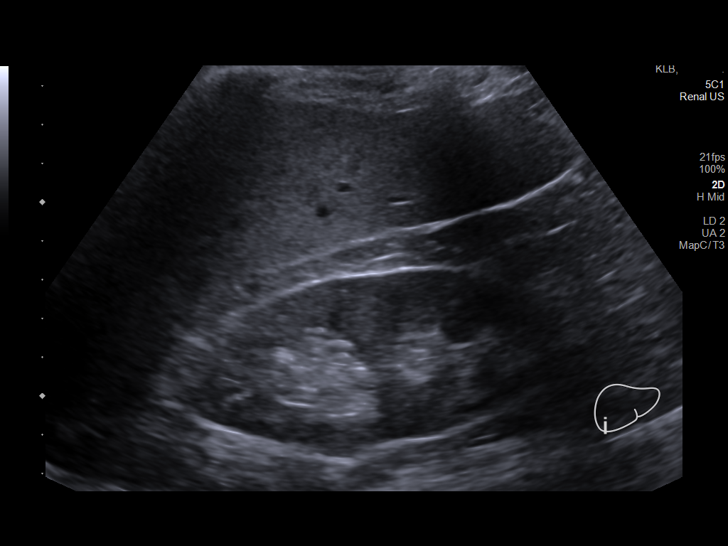
[im 26/48]
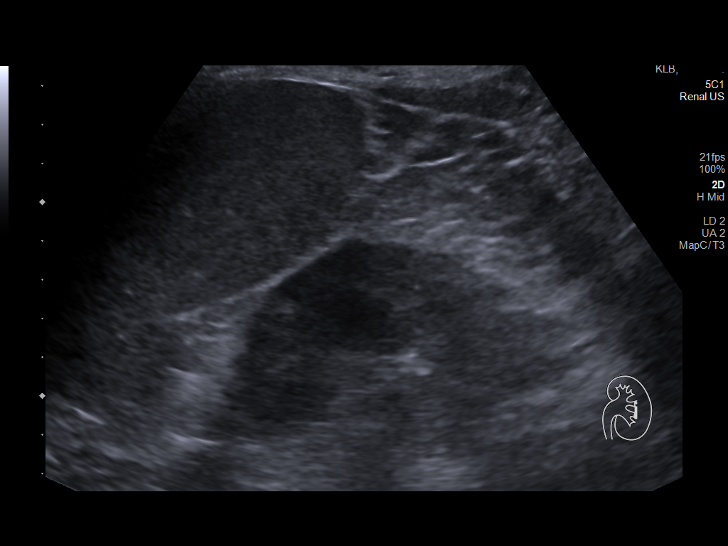
[im 30/48]
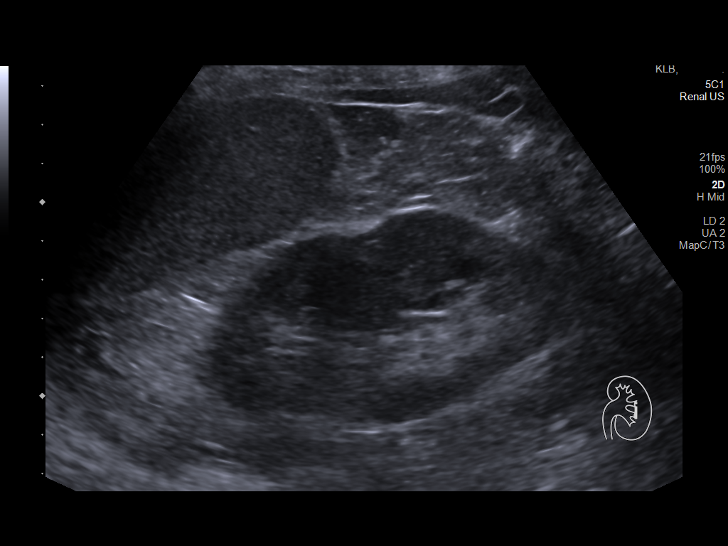
[im 32/48]
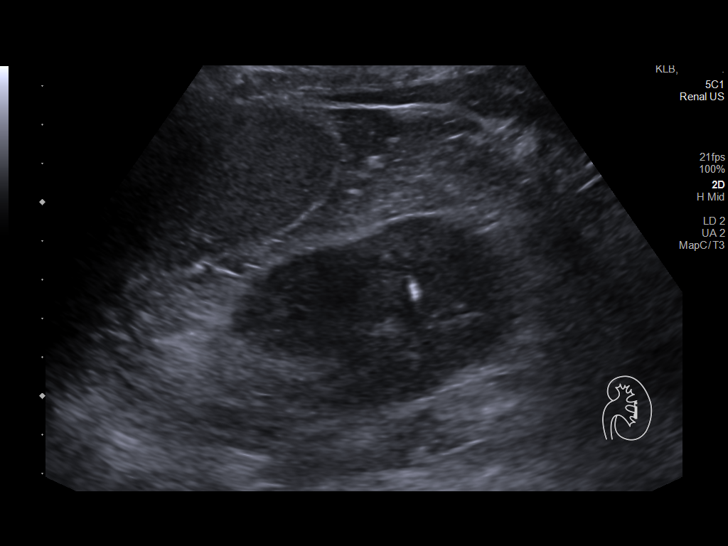
[im 36/48]
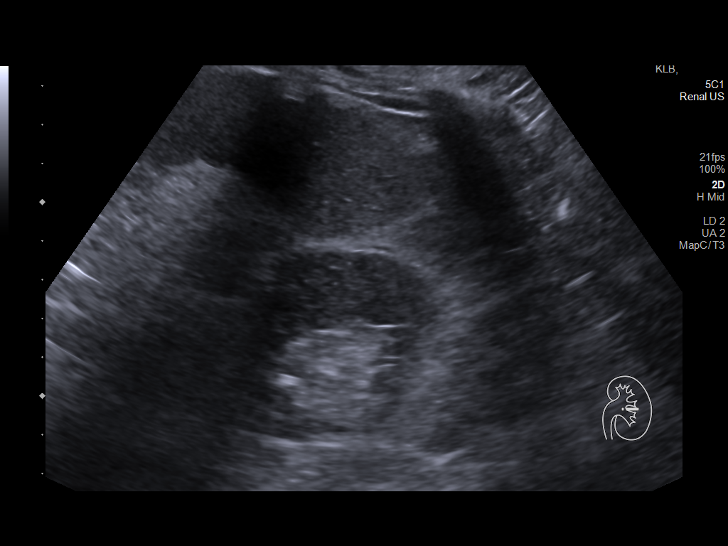
[im 40/48]
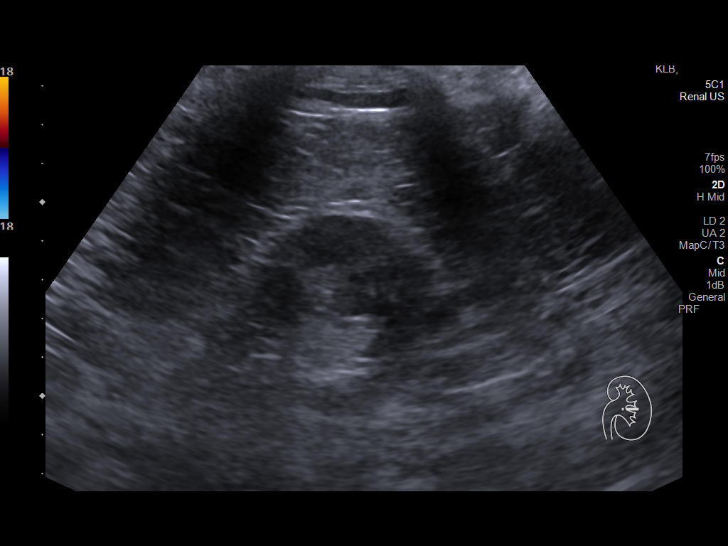
[im 44/48]
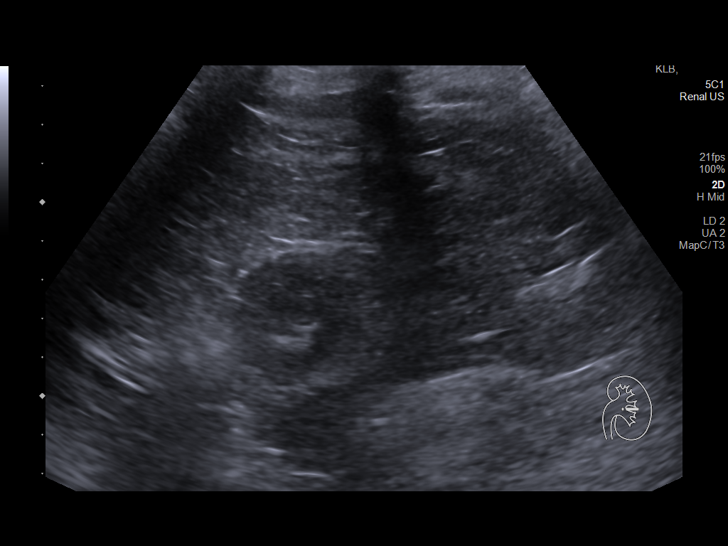
[im 48/48]
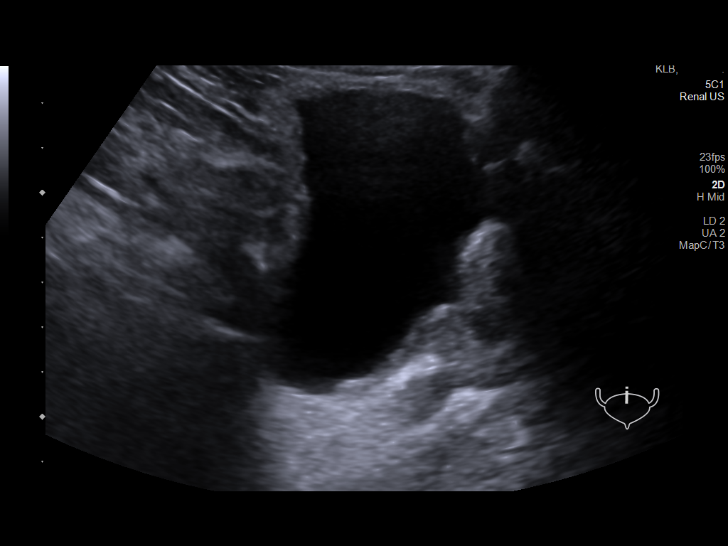

[14 of 25 positions shown; findings below may reference images not displayed]

FINDINGS: Right Kidney:

Renal measurements: 9.8 x 4.2 x 5.2 cm = volume: 112 mL. Renal
echogenicity within normal limits. No nephrolithiasis or
hydronephrosis. No focal renal mass.

Left Kidney:

Renal measurements: 10.4 x 5.0 x 4.7 cm = volume: 129 mL. Renal
echogenicity within normal limits. No nephrolithiasis or
hydronephrosis. No focal renal mass.

Bladder:

Appears normal for degree of bladder distention. A right ureteral
jet was visualized. A left jet not seen.

Other:

None.
IMPRESSION: Normal renal ultrasound. No hydronephrosis or other significant
finding. Renal echogenicity within normal limits.

## 2022-03-27 DIAGNOSIS — G5750 Tarsal tunnel syndrome, unspecified lower limb: Secondary | ICD-10-CM | POA: Diagnosis not present

## 2022-03-27 DIAGNOSIS — M79671 Pain in right foot: Secondary | ICD-10-CM | POA: Diagnosis not present

## 2022-04-13 DIAGNOSIS — I10 Essential (primary) hypertension: Secondary | ICD-10-CM | POA: Diagnosis not present

## 2022-04-13 DIAGNOSIS — N1831 Chronic kidney disease, stage 3a: Secondary | ICD-10-CM | POA: Diagnosis not present

## 2022-05-07 DIAGNOSIS — Z0001 Encounter for general adult medical examination with abnormal findings: Secondary | ICD-10-CM | POA: Diagnosis not present

## 2022-05-07 DIAGNOSIS — I10 Essential (primary) hypertension: Secondary | ICD-10-CM | POA: Diagnosis not present

## 2022-05-07 DIAGNOSIS — J309 Allergic rhinitis, unspecified: Secondary | ICD-10-CM | POA: Diagnosis not present

## 2022-05-07 DIAGNOSIS — N1831 Chronic kidney disease, stage 3a: Secondary | ICD-10-CM | POA: Diagnosis not present

## 2022-05-07 DIAGNOSIS — K219 Gastro-esophageal reflux disease without esophagitis: Secondary | ICD-10-CM | POA: Diagnosis not present

## 2022-05-07 DIAGNOSIS — Z7189 Other specified counseling: Secondary | ICD-10-CM | POA: Diagnosis not present

## 2022-05-07 DIAGNOSIS — R7303 Prediabetes: Secondary | ICD-10-CM | POA: Diagnosis not present

## 2022-05-07 DIAGNOSIS — J8410 Pulmonary fibrosis, unspecified: Secondary | ICD-10-CM | POA: Diagnosis not present

## 2022-05-07 DIAGNOSIS — J40 Bronchitis, not specified as acute or chronic: Secondary | ICD-10-CM | POA: Diagnosis not present

## 2022-05-07 DIAGNOSIS — Z1389 Encounter for screening for other disorder: Secondary | ICD-10-CM | POA: Diagnosis not present

## 2022-05-07 DIAGNOSIS — M179 Osteoarthritis of knee, unspecified: Secondary | ICD-10-CM | POA: Diagnosis not present

## 2022-05-08 DIAGNOSIS — I10 Essential (primary) hypertension: Secondary | ICD-10-CM | POA: Diagnosis not present

## 2022-05-08 DIAGNOSIS — N1831 Chronic kidney disease, stage 3a: Secondary | ICD-10-CM | POA: Diagnosis not present

## 2022-05-27 DIAGNOSIS — D638 Anemia in other chronic diseases classified elsewhere: Secondary | ICD-10-CM | POA: Diagnosis not present

## 2022-05-27 DIAGNOSIS — N1831 Chronic kidney disease, stage 3a: Secondary | ICD-10-CM | POA: Diagnosis not present

## 2022-05-27 DIAGNOSIS — I129 Hypertensive chronic kidney disease with stage 1 through stage 4 chronic kidney disease, or unspecified chronic kidney disease: Secondary | ICD-10-CM | POA: Diagnosis not present

## 2022-05-27 DIAGNOSIS — I708 Atherosclerosis of other arteries: Secondary | ICD-10-CM | POA: Diagnosis not present

## 2022-06-07 DIAGNOSIS — I10 Essential (primary) hypertension: Secondary | ICD-10-CM | POA: Diagnosis not present

## 2022-06-07 DIAGNOSIS — N1831 Chronic kidney disease, stage 3a: Secondary | ICD-10-CM | POA: Diagnosis not present

## 2022-06-19 DIAGNOSIS — M109 Gout, unspecified: Secondary | ICD-10-CM | POA: Diagnosis not present

## 2022-06-19 DIAGNOSIS — R030 Elevated blood-pressure reading, without diagnosis of hypertension: Secondary | ICD-10-CM | POA: Diagnosis not present

## 2022-06-26 DIAGNOSIS — N1832 Chronic kidney disease, stage 3b: Secondary | ICD-10-CM | POA: Diagnosis not present

## 2022-07-03 DIAGNOSIS — E876 Hypokalemia: Secondary | ICD-10-CM | POA: Diagnosis not present

## 2022-07-03 DIAGNOSIS — D638 Anemia in other chronic diseases classified elsewhere: Secondary | ICD-10-CM | POA: Diagnosis not present

## 2022-07-03 DIAGNOSIS — N170 Acute kidney failure with tubular necrosis: Secondary | ICD-10-CM | POA: Diagnosis not present

## 2022-07-03 DIAGNOSIS — N1831 Chronic kidney disease, stage 3a: Secondary | ICD-10-CM | POA: Diagnosis not present

## 2022-07-08 DIAGNOSIS — I10 Essential (primary) hypertension: Secondary | ICD-10-CM | POA: Diagnosis not present

## 2022-07-08 DIAGNOSIS — N1831 Chronic kidney disease, stage 3a: Secondary | ICD-10-CM | POA: Diagnosis not present

## 2022-08-07 DIAGNOSIS — N1831 Chronic kidney disease, stage 3a: Secondary | ICD-10-CM | POA: Diagnosis not present

## 2022-08-07 DIAGNOSIS — I10 Essential (primary) hypertension: Secondary | ICD-10-CM | POA: Diagnosis not present

## 2022-09-04 DIAGNOSIS — D631 Anemia in chronic kidney disease: Secondary | ICD-10-CM | POA: Diagnosis not present

## 2022-09-04 DIAGNOSIS — N183 Chronic kidney disease, stage 3 unspecified: Secondary | ICD-10-CM | POA: Diagnosis not present

## 2022-09-04 DIAGNOSIS — R809 Proteinuria, unspecified: Secondary | ICD-10-CM | POA: Diagnosis not present

## 2022-09-04 DIAGNOSIS — N189 Chronic kidney disease, unspecified: Secondary | ICD-10-CM | POA: Diagnosis not present

## 2022-09-04 DIAGNOSIS — E211 Secondary hyperparathyroidism, not elsewhere classified: Secondary | ICD-10-CM | POA: Diagnosis not present

## 2022-09-04 DIAGNOSIS — I1310 Hypertensive heart and chronic kidney disease without heart failure, with stage 1 through stage 4 chronic kidney disease, or unspecified chronic kidney disease: Secondary | ICD-10-CM | POA: Diagnosis not present

## 2022-09-07 DIAGNOSIS — I10 Essential (primary) hypertension: Secondary | ICD-10-CM | POA: Diagnosis not present

## 2022-09-07 DIAGNOSIS — N1831 Chronic kidney disease, stage 3a: Secondary | ICD-10-CM | POA: Diagnosis not present

## 2022-10-08 DIAGNOSIS — I10 Essential (primary) hypertension: Secondary | ICD-10-CM | POA: Diagnosis not present

## 2022-10-08 DIAGNOSIS — N1831 Chronic kidney disease, stage 3a: Secondary | ICD-10-CM | POA: Diagnosis not present

## 2022-11-13 ENCOUNTER — Ambulatory Visit (HOSPITAL_COMMUNITY)
Admission: RE | Admit: 2022-11-13 | Discharge: 2022-11-13 | Disposition: A | Discharge status: Home / Self Care | Payer: Medicare Other | Source: Ambulatory Visit | Attending: Gerontology | Admitting: Gerontology

## 2022-11-13 ENCOUNTER — Other Ambulatory Visit (HOSPITAL_COMMUNITY): Payer: Self-pay | Admitting: Gerontology

## 2022-11-13 DIAGNOSIS — R52 Pain, unspecified: Secondary | ICD-10-CM

## 2022-11-13 DIAGNOSIS — J40 Bronchitis, not specified as acute or chronic: Secondary | ICD-10-CM | POA: Diagnosis not present

## 2022-11-13 DIAGNOSIS — D631 Anemia in chronic kidney disease: Secondary | ICD-10-CM | POA: Diagnosis not present

## 2022-11-13 DIAGNOSIS — N189 Chronic kidney disease, unspecified: Secondary | ICD-10-CM | POA: Diagnosis not present

## 2022-11-13 DIAGNOSIS — M179 Osteoarthritis of knee, unspecified: Secondary | ICD-10-CM | POA: Diagnosis not present

## 2022-11-13 DIAGNOSIS — J8410 Pulmonary fibrosis, unspecified: Secondary | ICD-10-CM | POA: Diagnosis not present

## 2022-11-13 DIAGNOSIS — M1811 Unilateral primary osteoarthritis of first carpometacarpal joint, right hand: Secondary | ICD-10-CM | POA: Diagnosis not present

## 2022-11-13 DIAGNOSIS — M25531 Pain in right wrist: Secondary | ICD-10-CM | POA: Diagnosis not present

## 2022-11-13 DIAGNOSIS — Z23 Encounter for immunization: Secondary | ICD-10-CM | POA: Diagnosis not present

## 2022-11-13 DIAGNOSIS — I10 Essential (primary) hypertension: Secondary | ICD-10-CM | POA: Diagnosis not present

## 2022-11-19 DIAGNOSIS — M109 Gout, unspecified: Secondary | ICD-10-CM | POA: Diagnosis not present

## 2022-11-19 DIAGNOSIS — I129 Hypertensive chronic kidney disease with stage 1 through stage 4 chronic kidney disease, or unspecified chronic kidney disease: Secondary | ICD-10-CM | POA: Diagnosis not present

## 2022-11-19 DIAGNOSIS — D638 Anemia in other chronic diseases classified elsewhere: Secondary | ICD-10-CM | POA: Diagnosis not present

## 2022-11-19 DIAGNOSIS — N1831 Chronic kidney disease, stage 3a: Secondary | ICD-10-CM | POA: Diagnosis not present

## 2022-12-14 DIAGNOSIS — I10 Essential (primary) hypertension: Secondary | ICD-10-CM | POA: Diagnosis not present

## 2022-12-14 DIAGNOSIS — N1831 Chronic kidney disease, stage 3a: Secondary | ICD-10-CM | POA: Diagnosis not present

## 2023-01-13 DIAGNOSIS — I10 Essential (primary) hypertension: Secondary | ICD-10-CM | POA: Diagnosis not present

## 2023-01-13 DIAGNOSIS — N1831 Chronic kidney disease, stage 3a: Secondary | ICD-10-CM | POA: Diagnosis not present

## 2023-02-13 DIAGNOSIS — I10 Essential (primary) hypertension: Secondary | ICD-10-CM | POA: Diagnosis not present

## 2023-02-13 DIAGNOSIS — N1831 Chronic kidney disease, stage 3a: Secondary | ICD-10-CM | POA: Diagnosis not present

## 2023-03-03 DIAGNOSIS — M25511 Pain in right shoulder: Secondary | ICD-10-CM | POA: Diagnosis not present

## 2023-03-03 DIAGNOSIS — M25531 Pain in right wrist: Secondary | ICD-10-CM | POA: Diagnosis not present

## 2023-03-16 DIAGNOSIS — I10 Essential (primary) hypertension: Secondary | ICD-10-CM | POA: Diagnosis not present

## 2023-03-16 DIAGNOSIS — N1831 Chronic kidney disease, stage 3a: Secondary | ICD-10-CM | POA: Diagnosis not present

## 2023-04-13 DIAGNOSIS — N1831 Chronic kidney disease, stage 3a: Secondary | ICD-10-CM | POA: Diagnosis not present

## 2023-04-13 DIAGNOSIS — I10 Essential (primary) hypertension: Secondary | ICD-10-CM | POA: Diagnosis not present

## 2023-05-12 DIAGNOSIS — I1310 Hypertensive heart and chronic kidney disease without heart failure, with stage 1 through stage 4 chronic kidney disease, or unspecified chronic kidney disease: Secondary | ICD-10-CM | POA: Diagnosis not present

## 2023-05-12 DIAGNOSIS — N183 Chronic kidney disease, stage 3 unspecified: Secondary | ICD-10-CM | POA: Diagnosis not present

## 2023-05-12 DIAGNOSIS — N189 Chronic kidney disease, unspecified: Secondary | ICD-10-CM | POA: Diagnosis not present

## 2023-05-12 DIAGNOSIS — R809 Proteinuria, unspecified: Secondary | ICD-10-CM | POA: Diagnosis not present

## 2023-05-12 DIAGNOSIS — Z758 Other problems related to medical facilities and other health care: Secondary | ICD-10-CM | POA: Diagnosis not present

## 2023-05-12 DIAGNOSIS — D631 Anemia in chronic kidney disease: Secondary | ICD-10-CM | POA: Diagnosis not present

## 2023-05-19 DIAGNOSIS — K219 Gastro-esophageal reflux disease without esophagitis: Secondary | ICD-10-CM | POA: Diagnosis not present

## 2023-05-19 DIAGNOSIS — R7303 Prediabetes: Secondary | ICD-10-CM | POA: Diagnosis not present

## 2023-05-19 DIAGNOSIS — D638 Anemia in other chronic diseases classified elsewhere: Secondary | ICD-10-CM | POA: Diagnosis not present

## 2023-05-19 DIAGNOSIS — Z1389 Encounter for screening for other disorder: Secondary | ICD-10-CM | POA: Diagnosis not present

## 2023-05-19 DIAGNOSIS — J309 Allergic rhinitis, unspecified: Secondary | ICD-10-CM | POA: Diagnosis not present

## 2023-05-19 DIAGNOSIS — Z0001 Encounter for general adult medical examination with abnormal findings: Secondary | ICD-10-CM | POA: Diagnosis not present

## 2023-05-19 DIAGNOSIS — J8410 Pulmonary fibrosis, unspecified: Secondary | ICD-10-CM | POA: Diagnosis not present

## 2023-05-19 DIAGNOSIS — N1832 Chronic kidney disease, stage 3b: Secondary | ICD-10-CM | POA: Diagnosis not present

## 2023-05-19 DIAGNOSIS — I10 Essential (primary) hypertension: Secondary | ICD-10-CM | POA: Diagnosis not present

## 2023-05-19 DIAGNOSIS — M179 Osteoarthritis of knee, unspecified: Secondary | ICD-10-CM | POA: Diagnosis not present

## 2023-05-19 DIAGNOSIS — R7301 Impaired fasting glucose: Secondary | ICD-10-CM | POA: Diagnosis not present

## 2023-05-19 DIAGNOSIS — I129 Hypertensive chronic kidney disease with stage 1 through stage 4 chronic kidney disease, or unspecified chronic kidney disease: Secondary | ICD-10-CM | POA: Diagnosis not present

## 2023-05-19 DIAGNOSIS — N1831 Chronic kidney disease, stage 3a: Secondary | ICD-10-CM | POA: Diagnosis not present

## 2023-05-19 DIAGNOSIS — E876 Hypokalemia: Secondary | ICD-10-CM | POA: Diagnosis not present

## 2023-06-18 DIAGNOSIS — I10 Essential (primary) hypertension: Secondary | ICD-10-CM | POA: Diagnosis not present

## 2023-06-18 DIAGNOSIS — N1831 Chronic kidney disease, stage 3a: Secondary | ICD-10-CM | POA: Diagnosis not present

## 2023-07-09 DIAGNOSIS — N189 Chronic kidney disease, unspecified: Secondary | ICD-10-CM | POA: Diagnosis not present

## 2023-07-09 DIAGNOSIS — D631 Anemia in chronic kidney disease: Secondary | ICD-10-CM | POA: Diagnosis not present

## 2023-07-09 DIAGNOSIS — R809 Proteinuria, unspecified: Secondary | ICD-10-CM | POA: Diagnosis not present

## 2023-07-16 DIAGNOSIS — I129 Hypertensive chronic kidney disease with stage 1 through stage 4 chronic kidney disease, or unspecified chronic kidney disease: Secondary | ICD-10-CM | POA: Diagnosis not present

## 2023-07-16 DIAGNOSIS — I708 Atherosclerosis of other arteries: Secondary | ICD-10-CM | POA: Diagnosis not present

## 2023-07-16 DIAGNOSIS — N1832 Chronic kidney disease, stage 3b: Secondary | ICD-10-CM | POA: Diagnosis not present

## 2023-07-16 DIAGNOSIS — N179 Acute kidney failure, unspecified: Secondary | ICD-10-CM | POA: Diagnosis not present

## 2023-07-19 DIAGNOSIS — I10 Essential (primary) hypertension: Secondary | ICD-10-CM | POA: Diagnosis not present

## 2023-07-19 DIAGNOSIS — N1831 Chronic kidney disease, stage 3a: Secondary | ICD-10-CM | POA: Diagnosis not present

## 2023-08-11 DIAGNOSIS — Z79899 Other long term (current) drug therapy: Secondary | ICD-10-CM | POA: Diagnosis not present

## 2023-08-11 DIAGNOSIS — I129 Hypertensive chronic kidney disease with stage 1 through stage 4 chronic kidney disease, or unspecified chronic kidney disease: Secondary | ICD-10-CM | POA: Diagnosis not present

## 2023-08-11 DIAGNOSIS — K610 Anal abscess: Secondary | ICD-10-CM | POA: Diagnosis not present

## 2023-08-11 DIAGNOSIS — K611 Rectal abscess: Secondary | ICD-10-CM | POA: Diagnosis not present

## 2023-08-11 DIAGNOSIS — M6281 Muscle weakness (generalized): Secondary | ICD-10-CM | POA: Diagnosis not present

## 2023-08-11 DIAGNOSIS — N189 Chronic kidney disease, unspecified: Secondary | ICD-10-CM | POA: Diagnosis not present

## 2023-08-11 DIAGNOSIS — K6289 Other specified diseases of anus and rectum: Secondary | ICD-10-CM | POA: Diagnosis not present

## 2023-08-12 DIAGNOSIS — K610 Anal abscess: Secondary | ICD-10-CM | POA: Diagnosis not present

## 2023-08-12 DIAGNOSIS — Z79899 Other long term (current) drug therapy: Secondary | ICD-10-CM | POA: Diagnosis not present

## 2023-08-12 DIAGNOSIS — I10 Essential (primary) hypertension: Secondary | ICD-10-CM | POA: Diagnosis not present

## 2023-08-12 DIAGNOSIS — K611 Rectal abscess: Secondary | ICD-10-CM | POA: Diagnosis not present

## 2023-08-12 NOTE — Discharge Summary (Signed)
 DISCHARGE SUMMARY [facility]   Discharge date:   August 12, 2023 Length of stay:    LOS: 0 days    Discharge Service:   [facility] Hospitalists Discharge Attending Physician: Joana Marice Hurst, PA Discharge to:    To Home Condition at Discharge:  good Code status:                         Full Code    _________________________________________________________________________   Admission HPI    Patient admitted on: 08/11/2023  1:41 PM  Patient admitted by: [name], DO    CHIEF COMPLAINT: Rectal pain   Day of admission HPI:  Brent Payne  is a 78 y.o. y.o. male with a PMH significant for hypertension but no diabetes who presented with complaint of rectal pain.   Patient states that he has been having rectal pain for about a week and it got worse over the last 2 to 3 days.   No previous history of same.   Evaluation in the ER showed perirectal abscess at 9 PM.  CT pelvis with contrast showed small rim-enhancing fluid collection along the left aspect of anal verge measuring approximately 1.6 x 1.0 cm suspicious for perianal abscess.   ER contacted surgery who recommended hospitalist admit patient under their service and to consult surgeon with plan to do surgery first thing in the morning.   Hospitalist admitted patient to their service.  Patient will be given dinner tonight and n.p.o. after midnight.  DVT prophylaxis will be SCDs, we will avoid pharmacological DVT prophylaxis.   Patient given a basic understanding of why they are being admitted and what her plan is.  He and his wife expressed understanding.   Patient states that he takes no blood thinner.  Review of medication dispense history from epic from June 22, 2022 to August 11, 2023 reveals he is on albuterol sulfate/allopurinol/atorvastatin/fluticasone/hydrochlorothiazide /losartan /montelukast omeprazole/sildenafil/tamsulosin .  He gets these filled at [facility] pharmacy.  The provider who wrote these  medications is Dr. Leafy, AGNP.   Patient admitted on Home O2? - no Patient on home anticoagulant? -  no Patient admitted with Chronic home foley catheter? - no Foley catheter placed or replaced by another service prior to admission? - no Central Line Status: NONE   Mental Status on Admission: The patient is Alert and oriented to PERSON The patient is Alert And oriented to TIME The patient is Alert and oriented to LOCATION   Problem List, Assessment & Plan     ASSESSMENT & PLAN (In order of descending acuity)   Active Problems:   Perirectal abscess Admit to hospitalist observation service to the MedSurg unit with probability of less than 48-hour stay Surgical consult N.p.o. after midnight Heart healthy diet for dinner and then n.p.o. at midnight IV fluids of normal saline at 100 cc/h Hold DVT prophylaxis; okay to do SCDs; patient does not use any home SCDs Continue appropriate home meds IV hydralazine as needed with parameters Pain management with oxycodone  every 6 hours as needed pain Nicotine patch Colace     ADDITIONAL NON-ACUTE FINDINGS, OBSERVATIONS, FAMILY DISCUSSIONS, ETC. (When present):   Physical exam General: No acute distress HEENT [place]/AT; moist mucous membrane Heart: Heart rate = 60s and 70s with occasional 50s: Rhythm is =   regular Lungs: Clear to auscultation bilaterally Abdomen: Soft nontender nondistended Extremities: No peripheral edema Skin: Dry no rash; patient is status post I&D of perirectal abscess.   Neuropsych: Within normal  limits     DVT Prophylaxis Ordered: pneumatic compression device  __________________________________________________________________________     August 12, 2023 => Vital signs this morning show low pulse rate in the 40s and 50s.  Patient has history of low pulse rate.  EKG done yesterday which showed bradycardia. Today, patient was taken to her surgery for perianal abscess drainage and did really well for surgery. I  communicated with Dr. Maranda to see when patient can be discharged.  Dr. Maranda is agreeable that patient could be discharged today and he should follow-up with him in the wound care clinic on Friday. Arland Bars will educate wife on proper wound care for the perirectal abscess  Timeline of Significant Events: No notes on file   Mental Status On day of Discharge:  The patient is Alert and oriented to PERSON The patient is Alert And oriented to TIME The patient is Alert and oriented to LOCATION  CODE STATUS :                    Full Code   An advanced care planning discussion was had with patient and/or patient's decisions maker (documented separately).  Patient discharged on Home O2? - no Patient discharged on home anticoagulant? -  no  Foley Catheter status: None Central Line Status: NONE  Time Spent on Discharge I spent greater than 30 minutes counseling and coordinating care for the discharge of this patient. The patient and I discussed the importance of outpatient follow-up as well as concerning signs and symptoms that would require immediate evaluation by a medical professional. The aforementioned conversation participants understand  and did show insight. I did use teachback to ensure understanding. The above participant/s is aware that not following the discussed plan, recommendations, and follow up can lead to severe negative effects on the patient's health, up to and including death.  Discharge Medications     Your Medication List     STOP taking these medications    ibuprofen 400 MG tablet Commonly known as: MOTRIN       START taking these medications    amoxicillin-clavulanate 875-125 mg per tablet Commonly known as: AUGMENTIN Take 1 tablet by mouth two (2) times a day for 10 days.   docusate sodium  100 MG capsule Commonly known as: COLACE Take 1 capsule (100 mg total) by mouth two (2) times a day for 10 days.   oxyCODONE -acetaminophen  5-325 mg per  tablet Commonly known as: PERCOCET Take 1 tablet by mouth every four (4) hours as needed for up to 5 days.       CONTINUE taking these medications    allopurinol 100 MG tablet Commonly known as: ZYLOPRIM Take 1 tablet (100 mg total) by mouth daily as needed.   atorvastatin 20 MG tablet Commonly known as: LIPITOR Take 1 tablet (20 mg total) by mouth daily.   hydroCHLOROthiazide  12.5 MG tablet Take 1 tablet (12.5 mg total) by mouth in the morning.   omeprazole 20 MG capsule Commonly known as: PriLOSEC Take 1 capsule (20 mg total) by mouth daily.       ASK your doctor about these medications    tamsulosin  0.4 mg capsule Commonly known as: FLOMAX  Take 1 capsule (0.4 mg total) by mouth daily.       _____________________________________  Nutrition:  ___________________________________________  Discharge Instructions   Discharge instructions =>  For your perirectal abscess, you will get draining from the nurse on how to take care of it.  We also recommend you take the following antibiotic: Augmentin 875/125 mg => take 1 tablet every 12 hours for 10 days.  Make sure you see Dr. Maranda, your surgeon, at wound care clinic this Friday, August 14, 2023 as scheduled.  Follow-up with your PCP after you see your surgeon. This coming Monday or Tuesday or Wednesday will be fine.   Nutrition:         Resume your normal nutrition                          Activity:                   Resume activity as advised by your surgeon.  Until you see him on Friday, recommend avoiding any kind of strenuous activity.  Meaning no lawnmowing even with the riding lawn more and outdoor activity for now.                Activity Instructions     Activity as tolerated         Appointments:                         Appointments which have been scheduled for you    Aug 14, 2023 9:15 AM (Arrive by 9:00 AM) RETURN GENERAL with Ellaree Sergio Maranda, MD [facility]) [address] [place] [address] [phone]    Additional instructions:   THE WOUND CENTER IS LOCATED AT THE DAY SURGERY / BIRTHING CENTER ENTRANCE OF THE HOSPITAL. Please arrive 15 minutes prior to your scheduled appt to check in. Please bring your photo ID, insurance card, and list of medications. If you need to cancel or change this appt, please call [phone] ext [phone] If you are 15 min past your appt time, you will be rescheduled at the physician's discretion. If you are a no show, late, or less than 24 hr cancellation, three times then you can be discharged from the clinic.      Follow Up:                              Follow Up instructions and Outpatient Referrals    Call MD for:  difficulty breathing, headache or visual disturbances     Call MD for:  persistent dizziness or light-headedness     Call MD for:  persistent nausea or vomiting     Call MD for:  severe uncontrolled pain     Call MD for: Temperature > 38.5 Celsius ( > 101.3 Fahrenheit)     Discharge instructions         No Known Allergies   Past Medical History[1]  Past Surgical History[2]   Family History[3]   Current Medications[4]  Imaging  CT Pelvis W  Contrast Result Date: 08/11/2023 Exam:  CT of the Pelvis with Contrast  History:  Evaluate for perianal abscess  Technique:  Routine CT of the pelvis with IV contrast.  IV contrast: 125 mL Omnipaque 300. Oral Contrast:  no.  AEC (automated exposure control) and/or manual techniques such as size-specific kV and mAs are employed where appropriate to reduce radiation exposure for all CT exams.  Comparison:  CT 10/03/2021  Pelvis CT Findings:  SOFT TISSUES: Small rim-enhancing fluid collection along the left aspect of the anal verge measuring approximately 1.6 x 1.0 cm (series 2, image 83).  BOWEL: No abnormal bowel dilation or bowel wall thickening. Normal appendix.   MESENTERY/PERITONEUM: No free air or free fluid.  BLADDER: Normal  PELVIC ORGANS: Unremarkable prostate and seminal vesicles. Small right hydrocele.  PELVIC VASCULATURE: Nonaneurysmal.  Moderate atherosclerotic calcifications  LYMPH NODES: No pelvic lymphadenopathy.  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 No acute osseous abnormalities.    Small rim-enhancing fluid collection along the left aspect of the anal verge measuring approximately 1.6 x 1.0 cm, suspicious for perianal abscess.  Signed (Electronic Signature): 08/11/2023 3:31 PM Signed By: Reyes Agreste, MD   Lab Results   Recent Labs    08/12/23 0505  WBC 4.9  HGB 11.6*  HCT 33.8*  PLT 207   Recent Labs    08/11/23 1414 08/12/23 0505  NA 137 141  K 3.8 3.8  CL 102 105  CO2 27.6 27.7  BUN 28* 22*  CREATININE 1.59* 1.65*  GLU 121 109  CALCIUM 8.8 9.1  ALBUMIN 3.6 3.6  PROT 7.6 7.7  BILITOT 0.4 0.3  AST 19 18  ALT 36 33  ALKPHOS 86 85  MG 2.0  --    Recent Labs    08/11/23 1414  INR 1.04   No results for input(s): WBCUA, NITRITE, LEUKOCYTESUR, BACTERIA, RBCUA, BLOODU, GLUCOSEU, PROTEINUA, KETONESU, KETUR in the last 72 hours. No results for input(s): OPIAU, BENZU, TRICYCLIC, PCPU, AMPHU, COCAU, CANNAU, BARBU, ETOH, ACETAMIN, SALICYLATE in the last 72 hours. No results for input(s): PREGTESTUR, PREGPOC in the last 72 hours. No results for input(s): OCCULTBLD, RAPSCRN, CDIFRPCR, CDIFFNAP1, A1C, CHOL, LDL, HDL, TRIG in the last 72 hours. No results for input(s): O2SOUR, FIO2ART, PHART, PCO2ART, PO2ART, HCO3ART, O2SATART, BEART in the last 72 hours. Pending Labs     Order Current Status   Aerobic/Anaerobic Culture In process       Home Medications   Prior to Admission medications  Medication Dose, Route, Frequency  allopurinol (ZYLOPRIM) 100 MG tablet 1 tablet, Oral, Daily PRN  atorvastatin (LIPITOR) 20  MG tablet 20 mg, Daily (standard)  hydroCHLOROthiazide  (HYDRODIURIL ) 12.5 MG tablet 12.5 mg, Daily (standard)  omeprazole (PRILOSEC) 20 MG capsule 1 capsule, Daily (standard)  amoxicillin-clavulanate (AUGMENTIN) 875-125 mg per tablet 1 tablet, Oral, 2 times a day (standard)  docusate sodium  (COLACE) 100 MG capsule 100 mg, Oral, 2 times a day (standard)  ibuprofen (MOTRIN) 400 MG tablet 400 mg, Oral, Every 6 hours PRN  oxyCODONE -acetaminophen  (PERCOCET) 5-325 mg per tablet 1 tablet, Oral, Every 4 hours PRN  tamsulosin  (FLOMAX ) 0.4 mg capsule 0.4 mg, Daily (standard) Patient not taking: Reported on 08/11/2023   Joana JONETTA Hurst, PA Hospitalist, [facility] 08/12/23, 1:46 PM       [1] Past Medical History: Diagnosis Date  . Hypertension   [2] No past surgical history on file. [3] History reviewed. No pertinent family history. [4]  Current Facility-Administered Medications:  .  acetaminophen  (TYLENOL ) suppository 650 mg, 650 mg, Rectal, Q6H PRN, Cathey, Ellaree Bound, MD .  acetaminophen  (TYLENOL ) tablet 650 mg, 650 mg, Oral, Q6H PRN, Cathey, Ellaree Bound, MD .  famotidine (PEPCID) tablet 20 mg, 20 mg, Oral, BID, Cathey, Ellaree Bound, MD, 20 mg at 08/12/23 0816 .  guaiFENesin (ROBITUSSIN) oral syrup, 200 mg, Oral, Q4H PRN, Cathey, Ellaree Bound, MD .  hydrALAZINE (APRESOLINE) injection 10  mg, 10 mg, Intravenous, Q6H PRN, Cathey, Ellaree Bound, MD .  hydroCHLOROthiazide  tablet 12.5 mg, 12.5 mg, Oral, Daily, Cathey, Ellaree Bound, MD, 12.5 mg at 08/12/23 0816 .  melatonin tablet 3 mg, 3 mg, Oral, Nightly PRN, Cathey, Ellaree Bound, MD .  nicotine (NICODERM CQ) 14 mg/24 hr patch 1 patch, 1 patch, Transdermal, Daily, Cathey, Ellaree Bound, MD .  ondansetron  (ZOFRAN ) injection 4 mg, 4 mg, Intravenous, Q6H PRN, Cathey, Ellaree Bound, MD .  oxyCODONE -acetaminophen  (PERCOCET) 5-325 mg tablet 1 tablet, 1 tablet, Oral, Q4H PRN, Cathey, Ellaree Bound, MD .  phenol  (CHLORASEPTIC) 1.4 % spray 2 spray, 2 spray, Mucous Membrane, Q2H PRN, Cathey, Ellaree Bound, MD .  piperacillin-tazobactam (ZOSYN) 3.375 g in sodium chloride  0.9 % (NS) 100 mL IVPB-connector bag, 3.375 g, Intravenous, Q8H, Cathey, Ellaree Bound, MD, Stopped at 08/12/23 1315 .  sodium chloride  (NS) 0.9 % infusion, 100 mL/hr, Intravenous, Continuous, Cathey, Ellaree Bound, MD, Stopped at 08/12/23 1345 .  tamsulosin  (FLOMAX ) 24 hr capsule 0.4 mg, 0.4 mg, Oral, Daily, Cathey, Ellaree Bound, MD, 0.4 mg at 08/12/23 [phone]

## 2023-08-14 DIAGNOSIS — M179 Osteoarthritis of knee, unspecified: Secondary | ICD-10-CM | POA: Diagnosis not present

## 2023-08-14 DIAGNOSIS — R001 Bradycardia, unspecified: Secondary | ICD-10-CM | POA: Diagnosis not present

## 2023-08-14 DIAGNOSIS — I10 Essential (primary) hypertension: Secondary | ICD-10-CM | POA: Diagnosis not present

## 2023-08-14 DIAGNOSIS — K611 Rectal abscess: Secondary | ICD-10-CM | POA: Diagnosis not present

## 2023-08-14 DIAGNOSIS — K219 Gastro-esophageal reflux disease without esophagitis: Secondary | ICD-10-CM | POA: Diagnosis not present

## 2023-09-18 DIAGNOSIS — N1831 Chronic kidney disease, stage 3a: Secondary | ICD-10-CM | POA: Diagnosis not present

## 2023-09-18 DIAGNOSIS — I10 Essential (primary) hypertension: Secondary | ICD-10-CM | POA: Diagnosis not present

## 2023-10-19 DIAGNOSIS — N1831 Chronic kidney disease, stage 3a: Secondary | ICD-10-CM | POA: Diagnosis not present

## 2023-10-19 DIAGNOSIS — I10 Essential (primary) hypertension: Secondary | ICD-10-CM | POA: Diagnosis not present

## 2023-11-04 DIAGNOSIS — I10 Essential (primary) hypertension: Secondary | ICD-10-CM | POA: Diagnosis not present

## 2023-11-04 DIAGNOSIS — N189 Chronic kidney disease, unspecified: Secondary | ICD-10-CM | POA: Diagnosis not present

## 2023-11-04 DIAGNOSIS — E611 Iron deficiency: Secondary | ICD-10-CM | POA: Diagnosis not present

## 2023-11-04 DIAGNOSIS — E211 Secondary hyperparathyroidism, not elsewhere classified: Secondary | ICD-10-CM | POA: Diagnosis not present

## 2023-11-04 DIAGNOSIS — R809 Proteinuria, unspecified: Secondary | ICD-10-CM | POA: Diagnosis not present

## 2023-11-17 DIAGNOSIS — N179 Acute kidney failure, unspecified: Secondary | ICD-10-CM | POA: Diagnosis not present

## 2023-11-17 DIAGNOSIS — I129 Hypertensive chronic kidney disease with stage 1 through stage 4 chronic kidney disease, or unspecified chronic kidney disease: Secondary | ICD-10-CM | POA: Diagnosis not present

## 2023-11-17 DIAGNOSIS — D638 Anemia in other chronic diseases classified elsewhere: Secondary | ICD-10-CM | POA: Diagnosis not present

## 2023-11-17 DIAGNOSIS — N1832 Chronic kidney disease, stage 3b: Secondary | ICD-10-CM | POA: Diagnosis not present

## 2023-11-19 DIAGNOSIS — N1831 Chronic kidney disease, stage 3a: Secondary | ICD-10-CM | POA: Diagnosis not present

## 2023-11-19 DIAGNOSIS — I10 Essential (primary) hypertension: Secondary | ICD-10-CM | POA: Diagnosis not present

## 2023-11-19 DIAGNOSIS — J309 Allergic rhinitis, unspecified: Secondary | ICD-10-CM | POA: Diagnosis not present

## 2023-11-19 DIAGNOSIS — Z23 Encounter for immunization: Secondary | ICD-10-CM | POA: Diagnosis not present

## 2023-11-19 DIAGNOSIS — M179 Osteoarthritis of knee, unspecified: Secondary | ICD-10-CM | POA: Diagnosis not present

## 2023-11-23 ENCOUNTER — Ambulatory Visit: Attending: Cardiology | Admitting: Cardiology

## 2023-11-23 NOTE — Progress Notes (Deleted)
 Clinical Summary Mr. Cookson is a 78 y.o.male seen today as a new consult, referred by Dr Carlette for the following medical problems.  1.Bradycardia - noted during recent admission with perirectal abscess, reportedly rates 40s to 50s - from pcp note ambulated hallways and HRs to 80s   Past Medical History:  Diagnosis Date   Allergy    Anemia    CKD (chronic kidney disease)    Hypertension    Primary localized osteoarthritis of left knee    Primary localized osteoarthritis of left knee    Prostate disease      No Known Allergies   Current Outpatient Medications  Medication Sig Dispense Refill   acetaminophen  (TYLENOL ) 500 MG tablet Take 500-1,000 mg by mouth every 6 (six) hours as needed for mild pain.     DANDELION PO Take 520 mg by mouth daily.     Ferrous Sulfate (IRON PO) Take 1 tablet by mouth daily. Hema Plex Nature's Plus with 85 mg or elemental Iron     hydrochlorothiazide  (HYDRODIURIL ) 12.5 MG tablet Take 12.5 mg by mouth daily.     losartan  (COZAAR ) 50 MG tablet Take 50 mg by mouth daily.     tamsulosin  (FLOMAX ) 0.4 MG CAPS capsule Take 0.4 mg by mouth daily.     No current facility-administered medications for this visit.     Past Surgical History:  Procedure Laterality Date   CATARACT EXTRACTION W/PHACO Right 05/16/2014   Procedure: CATARACT EXTRACTION PHACO AND INTRAOCULAR LENS PLACEMENT (IOC);  Surgeon: Oneil Platts, MD;  Location: [facility];  Service: Ophthalmology;  Laterality: Right;  CDE:6.38   COLONOSCOPY N/A 01/18/2020   Procedure: COLONOSCOPY;  Surgeon: Shaaron Lamar HERO, MD;  Location: [facility] SU[facility];  Service: Endoscopy;  Laterality: N/A;  9:15am   KNEE ARTHROSCOPY Left    30+ years ago   POLYPECTOMY  01/18/2020   Procedure: POLYPECTOMY;  Surgeon: Shaaron Lamar HERO, MD;  Location: [facility];  Service: Endoscopy;;   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 Fells reports that he quit smoking about 20 years ago. He has never used smokeless tobacco. Mr. Mccauslin reports no history of alcohol  use.   Review of Systems CONSTITUTIONAL: No weight loss, fever, chills, weakness or fatigue.  HEENT: Eyes: No visual loss, blurred vision, double vision or yellow sclerae.No hearing loss, sneezing, congestion, runny nose or sore throat.  SKIN: No rash or itching.  CARDIOVASCULAR:  RESPIRATORY: No shortness of breath, cough or sputum.  GASTROINTESTINAL: No anorexia, nausea, vomiting or diarrhea. No abdominal pain or blood.  GENITOURINARY: No burning on urination, no polyuria NEUROLOGICAL: No headache, dizziness, syncope, paralysis, ataxia, numbness or tingling in the extremities. No change in bowel or bladder control.  MUSCULOSKELETAL: No muscle, back pain, joint pain or stiffness.  LYMPHATICS: No enlarged nodes. No history of splenectomy.  PSYCHIATRIC: No history of depression or anxiety.  ENDOCRINOLOGIC: No reports of sweating, cold or heat intolerance. No polyuria or polydipsia.  SABRA   Physical Examination There were no vitals filed for this visit. There were no vitals  filed for this visit.  Gen: resting comfortably, no acute distress HEENT: no scleral icterus, pupils equal round and reactive, no palptable cervical adenopathy,  CV Resp: Clear to auscultation bilaterally GI: abdomen is soft, non-tender, non-distended, normal bowel sounds, no hepatosplenomegaly MSK: extremities are warm, no edema.  Skin: warm, no rash Neuro:  no focal deficits Psych: appropriate affect   Diagnostic Studies     Assessment and Plan        Dorn PHEBE Ross, M.D., F.A.C.C.

## 2023-12-01 ENCOUNTER — Encounter: Payer: Self-pay | Admitting: Cardiology

## 2023-12-01 ENCOUNTER — Ambulatory Visit: Attending: Cardiology | Admitting: Cardiology

## 2023-12-01 VITALS — BP 124/64 | HR 57 | Ht 69.5 in | Wt 174.8 lb

## 2023-12-01 DIAGNOSIS — R001 Bradycardia, unspecified: Secondary | ICD-10-CM

## 2023-12-01 NOTE — Progress Notes (Signed)
 Clinical Summary Brent Payne is a 77 y.o.male seen today as a new consult, referred by Dr Carlette for the following medical problems.   1.Bradycardia - noted during recent admission with perirectal abscess, reportedly rates 40s to 50s - reports 10/10 rectal pain at time of admission.  - from pcp note ambulated hallways and HRs to 80s  - no lightheadedness, no dizziness - walks regularly without exertional symptoms    Past Medical History:  Diagnosis Date   Allergy    Anemia    CKD (chronic kidney disease)    Hypertension    Primary localized osteoarthritis of left knee    Primary localized osteoarthritis of left knee    Prostate disease      No Known Allergies   Current Outpatient Medications  Medication Sig Dispense Refill   acetaminophen  (TYLENOL ) 500 MG tablet Take 500-1,000 mg by mouth every 6 (six) hours as needed for mild pain.     DANDELION PO Take 520 mg by mouth daily.     Ferrous Sulfate (IRON PO) Take 1 tablet by mouth daily. Hema Plex Nature's Plus with 85 mg or elemental Iron     hydrochlorothiazide  (HYDRODIURIL ) 12.5 MG tablet Take 12.5 mg by mouth daily.     losartan  (COZAAR ) 50 MG tablet Take 50 mg by mouth daily.     tamsulosin  (FLOMAX ) 0.4 MG CAPS capsule Take 0.4 mg by mouth daily.     No current facility-administered medications for this visit.     Past Surgical History:  Procedure Laterality Date   CATARACT EXTRACTION W/PHACO Right 05/16/2014   Procedure: CATARACT EXTRACTION PHACO AND INTRAOCULAR LENS PLACEMENT (IOC);  Surgeon: Oneil Platts, MD;  Location: [facility];  Service: Ophthalmology;  Laterality: Right;  CDE:6.38   COLONOSCOPY N/A 01/18/2020   Procedure: COLONOSCOPY;  Surgeon: Shaaron Lamar HERO, MD;  Location: [facility];  Service: Endoscopy;  Laterality: N/A;  9:15am   KNEE ARTHROSCOPY Left    30+ years ago   POLYPECTOMY  01/18/2020   Procedure: POLYPECTOMY;  Surgeon: Shaaron Lamar HERO, MD;  Location: [facility];  Service:  Endoscopy;;   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 Grieco reports that he quit smoking about 20 years ago. He has never used smokeless tobacco. Mr. Haye reports no history of alcohol  use.     Physical Examination Today's Vitals   12/01/23 1006  BP: 124/64  Pulse: (!) 57  SpO2: 97%  Weight: 174 lb 12.8 oz (79.3 kg)  Height: 5' 9.5 (1.765 m)   Body mass index is 25.44 kg/m.  Gen: resting comfortably, no acute distress HEENT: no scleral icterus, pupils equal round and reactive, no palptable cervical adenopathy,  CV: RRR, no mrg, no jvd Resp: Clear to auscultation bilaterally GI: abdomen is soft, non-tender, non-distended, normal bowel sounds, no hepatosplenomegaly MSK: extremities are warm, no edema.  Skin: warm, no rash Neuro:  no focal deficits Psych: appropriate affect     Assessment and Plan  1.Bradycardia - noted during admission with rectal abscess, reportedly rates 40s to 50s - EKG  shows sinus brady 57 today. From pcp note with ambulation HR to 80s during a follow up visit - no symptoms - suspect baseline HR high 50s to 60s. He reports 10/10 pain at time of his rectal abscess, perhaps some component of increased vagal tone that brought his rates down slightly further - nothing at this time would draw concern or warrant further workup, can f/u with cardiology just as needed. Would avoid av nodal agents    F/u as needed      Brent Payne, M.D.

## 2023-12-01 NOTE — Patient Instructions (Signed)
Medication Instructions:  Continue all current medications.  Labwork: none  Testing/Procedures: none  Follow-Up: As needed.    Any Other Special Instructions Will Be Listed Below (If Applicable).  If you need a refill on your cardiac medications before your next appointment, please call your pharmacy.  

## 2023-12-02 ENCOUNTER — Encounter: Payer: Self-pay | Admitting: Gastroenterology

## 2023-12-14 ENCOUNTER — Inpatient Hospital Stay

## 2023-12-14 ENCOUNTER — Inpatient Hospital Stay: Attending: Hematology and Oncology | Admitting: Hematology and Oncology

## 2023-12-14 VITALS — BP 119/65 | HR 53 | Temp 97.1°F | Resp 18 | Ht 69.5 in | Wt 173.0 lb

## 2023-12-14 DIAGNOSIS — N189 Chronic kidney disease, unspecified: Secondary | ICD-10-CM | POA: Diagnosis not present

## 2023-12-14 DIAGNOSIS — Z9841 Cataract extraction status, right eye: Secondary | ICD-10-CM | POA: Insufficient documentation

## 2023-12-14 DIAGNOSIS — Z87891 Personal history of nicotine dependence: Secondary | ICD-10-CM | POA: Insufficient documentation

## 2023-12-14 DIAGNOSIS — I129 Hypertensive chronic kidney disease with stage 1 through stage 4 chronic kidney disease, or unspecified chronic kidney disease: Secondary | ICD-10-CM | POA: Insufficient documentation

## 2023-12-14 DIAGNOSIS — Z8249 Family history of ischemic heart disease and other diseases of the circulatory system: Secondary | ICD-10-CM | POA: Insufficient documentation

## 2023-12-14 DIAGNOSIS — Z79899 Other long term (current) drug therapy: Secondary | ICD-10-CM | POA: Diagnosis not present

## 2023-12-14 DIAGNOSIS — Z96652 Presence of left artificial knee joint: Secondary | ICD-10-CM | POA: Diagnosis not present

## 2023-12-14 DIAGNOSIS — D72819 Decreased white blood cell count, unspecified: Secondary | ICD-10-CM | POA: Diagnosis not present

## 2023-12-14 DIAGNOSIS — D631 Anemia in chronic kidney disease: Secondary | ICD-10-CM | POA: Diagnosis present

## 2023-12-14 DIAGNOSIS — D649 Anemia, unspecified: Secondary | ICD-10-CM

## 2023-12-14 DIAGNOSIS — M1712 Unilateral primary osteoarthritis, left knee: Secondary | ICD-10-CM | POA: Insufficient documentation

## 2023-12-14 LAB — CBC WITH DIFFERENTIAL/PLATELET
Abs Immature Granulocytes: 0 K/uL (ref 0.00–0.07)
Basophils Absolute: 0.1 K/uL (ref 0.0–0.1)
Basophils Relative: 2 %
Eosinophils Absolute: 0.2 K/uL (ref 0.0–0.5)
Eosinophils Relative: 5 %
HCT: 36.5 % — ABNORMAL LOW (ref 39.0–52.0)
Hemoglobin: 12.2 g/dL — ABNORMAL LOW (ref 13.0–17.0)
Immature Granulocytes: 0 %
Lymphocytes Relative: 28 %
Lymphs Abs: 1 K/uL (ref 0.7–4.0)
MCH: 29.5 pg (ref 26.0–34.0)
MCHC: 33.4 g/dL (ref 30.0–36.0)
MCV: 88.2 fL (ref 80.0–100.0)
Monocytes Absolute: 0.3 K/uL (ref 0.1–1.0)
Monocytes Relative: 8 %
Neutro Abs: 2.1 K/uL (ref 1.7–7.7)
Neutrophils Relative %: 57 %
Platelets: 210 K/uL (ref 150–400)
RBC: 4.14 MIL/uL — ABNORMAL LOW (ref 4.22–5.81)
RDW: 13.1 % (ref 11.5–15.5)
WBC: 3.7 K/uL — ABNORMAL LOW (ref 4.0–10.5)
nRBC: 0 % (ref 0.0–0.2)

## 2023-12-14 LAB — COMPREHENSIVE METABOLIC PANEL WITH GFR
ALT: 27 U/L (ref 0–44)
AST: 25 U/L (ref 15–41)
Albumin: 4.5 g/dL (ref 3.5–5.0)
Alkaline Phosphatase: 87 U/L (ref 38–126)
Anion gap: 10 (ref 5–15)
BUN: 18 mg/dL (ref 8–23)
CO2: 27 mmol/L (ref 22–32)
Calcium: 9.5 mg/dL (ref 8.9–10.3)
Chloride: 103 mmol/L (ref 98–111)
Creatinine, Ser: 1.56 mg/dL — ABNORMAL HIGH (ref 0.61–1.24)
GFR, Estimated: 45 mL/min — ABNORMAL LOW (ref >60)
Glucose, Bld: 104 mg/dL — ABNORMAL HIGH (ref 70–99)
Potassium: 3.8 mmol/L (ref 3.5–5.1)
Sodium: 141 mmol/L (ref 135–145)
Total Bilirubin: 0.5 mg/dL (ref 0.0–1.2)
Total Protein: 7.9 g/dL (ref 6.5–8.1)

## 2023-12-14 LAB — VITAMIN B12: Vitamin B-12: 306 pg/mL (ref 180–914)

## 2023-12-14 NOTE — Progress Notes (Signed)
 [facility] CONSULT NOTE  Patient Care Team: Carlette Benita Area, MD as PCP - General (Internal Medicine) Alvan Dorn FALCON, MD as PCP - Cardiology (Cardiology) Shaaron Lamar HERO, MD as Consulting Physician (Gastroenterology) Delsie Riggs, NP as Nurse Practitioner (Nurse Practitioner)  CHIEF COMPLAINTS/PURPOSE OF CONSULTATION:  Anemia  ASSESSMENT & PLAN:   Assessment and Plan Assessment & Plan Mild anemia in the setting of chronic kidney disease Mild anemia likely due to chronic kidney disease.  Hemoglobin at 11.7 g/dL. Iron levels adequate, no iron deficiency or acute blood loss. - Continue OTC iron supplementation every other day. - Ordered blood work to rule out other nutritional deficiency, MM - If these labs are without any concerns, then he can continue return PRN - Follow up with Dr. Carlette.  Essential hypertension Hypertension well controlled with current medication regimen.  Mild leukopenia, no neutropenia Chronic finding, likely benign NO intervention needed.   HISTORY OF PRESENTING ILLNESS:  Brent Payne 78 y.o. male is here because of IDA  Discussed the use of AI scribe software for clinical note transcription with the patient, who gave verbal consent to proceed.  History of Present Illness Brent Payne is an 78 year old male with mild anemia and kidney disease who presents for evaluation of anemia. He is accompanied by his wife, Mrs. Forst.  He denies significant symptoms related to anemia, such as fatigue or weakness. He occasionally feels cold, particularly after bathing, but this is not consistent. He remains active, playing golf three times a week and engaging in activities with his 13 grandchildren.  He has a history of kidney disease. He is under the care of a nephrologist for this condition. He has previously received iron infusions. He was recently prescribed oral iron supplements.  He maintains a busy lifestyle with regular  physical activity, including walking and playing golf. He manages household chores and enjoys spending time with his grandchildren.  No chest pain, chest pressure, shortness of breath, blood in stool or urine, or unintentional weight loss.  His blood pressure is well-controlled with medication, which he takes regularly. He also takes omega-3 supplements daily.  All other systems were reviewed with the patient and are negative.  MEDICAL HISTORY:  Past Medical History:  Diagnosis Date   Allergy    Anemia    CKD (chronic kidney disease)    Hypertension    Primary localized osteoarthritis of left knee    Primary localized osteoarthritis of left knee    Prostate disease     SURGICAL HISTORY: Past Surgical History:  Procedure Laterality Date   CATARACT EXTRACTION W/PHACO Right 05/16/2014   Procedure: CATARACT EXTRACTION PHACO AND INTRAOCULAR LENS PLACEMENT (IOC);  Surgeon: Oneil Platts, MD;  Location: [facility];  Service: Ophthalmology;  Laterality: Right;  CDE:6.38   COLONOSCOPY N/A 01/18/2020   Procedure: COLONOSCOPY;  Surgeon: Shaaron Lamar HERO, MD;  Location: [facility];  Service: Endoscopy;  Laterality: N/A;  9:15am   KNEE ARTHROSCOPY Left    30+ years ago   POLYPECTOMY  01/18/2020   Procedure: POLYPECTOMY;  Surgeon: Shaaron Lamar HERO, MD;  Location: [facility];  Service: Endoscopy;;   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   Procedure: LEFT TOTAL KNEE ARTHROPLASTY;  Surgeon: Jane Lamar, MD;  Location: [facility];  Service: Orthopedics;  Laterality: Left;    SOCIAL HISTORY: Social History   Socioeconomic History   Marital status: Married    Spouse name: Not on file   Number of children: Not on  file   Years of education: Not on file   Highest education level: Not on file  Occupational History   Not on file  Tobacco Use   Smoking status: Former    Current packs/day: 0.00    Types: Cigarettes    Quit date: 05/11/2003    Years since quitting: 20.6   Smokeless tobacco: Never   Substance and Sexual Activity   Alcohol  use: No   Drug use: No   Sexual activity: Not on file  Other Topics Concern   Not on file  Social History Narrative   Not on file   Social Drivers of Health   Financial Resource Strain: Low Risk (08/11/2023)   Received from [facility]   Overall Financial Resource Strain (CARDIA)    How hard is it for you to pay for the very basics like food, housing, medical care, and heating?: Not hard at all  Food Insecurity: No Food Insecurity (08/11/2023)   Received from [facility]   Hunger Vital Sign    Within the past 12 months, you worried that your food would run out before you got the money to buy more.: Never true    Within the past 12 months, the food you bought just didn't last and you didn't have money to get more.: Never true  Transportation Needs: No Transportation Needs (08/11/2023)   Received from [facility] - Transportation    Lack of Transportation (Medical): No    Lack of Transportation (Non-Medical): No  Physical Activity: Sufficiently Active (08/11/2023)   Received from [facility]   Exercise Vital Sign    On average, how many days per week do you engage in moderate to strenuous exercise (like a brisk walk)?: 3 days    On average, how many minutes do you engage in exercise at this level?: 150+ min  Stress: No Stress Concern Present (08/11/2023)   Received from [facility] - Occupational Stress Questionnaire    Do you feel stress - tense, restless, nervous, or anxious, or unable to sleep at night because your mind is troubled all the time - these days?: Not at all  Social Connections: Moderately Integrated (08/11/2023)   Received from [facility]   Social Connection and Isolation Panel    In a typical week, how many times do you talk on the phone with family, friends, or neighbors?: More than three times a week    How often do you get together with friends or  relatives?: More than three times a week    How often do you attend church or religious services?: More than 4 times per year    Do you belong to any clubs or organizations such as church groups, unions, fraternal or athletic groups, or school groups?: No    How often do you attend meetings of the clubs or organizations you belong to?: Never    Are you married, widowed, divorced, separated, never married, or living with a partner?: Married  Intimate Partner Violence: Not At Risk (08/11/2023)   Received from [facility]   Humiliation, Afraid, Rape, and Kick questionnaire    Within the last year, have you been afraid of your partner or ex-partner?: No    Within the last year, have you been humiliated or emotionally abused in other ways by your partner or ex-partner?: No    Within the last year, have you been kicked, hit, slapped,  or otherwise physically hurt by your partner or ex-partner?: No    Within the last year, have you been raped or forced to have any kind of sexual activity by your partner or ex-partner?: No    FAMILY HISTORY: Family History  Problem Relation Age of Onset   Other Mother    Hypertension Mother    Other Father    Hypertension Father    Colon cancer Neg Hx     ALLERGIES:  has no known allergies.  MEDICATIONS:  Current Outpatient Medications  Medication Sig Dispense Refill   acetaminophen  (TYLENOL ) 500 MG tablet Take 500-1,000 mg by mouth every 6 (six) hours as needed for mild pain.     atorvastatin (LIPITOR) 20 MG tablet Take 20 mg by mouth at bedtime.     DANDELION PO Take 520 mg by mouth daily. (Patient not taking: Reported on 12/14/2023)     Ferrous Sulfate (IRON PO) Take 1 tablet by mouth daily. Hema Plex Nature's Plus with 85 mg or elemental Iron     fluticasone (FLONASE) 50 MCG/ACT nasal spray Place 1 spray into both nostrils daily.     hydrochlorothiazide  (HYDRODIURIL ) 12.5 MG tablet Take 12.5 mg by mouth daily.     loratadine  (CLARITIN ) 10 MG tablet  Take 10 mg by mouth daily as needed.     losartan  (COZAAR ) 50 MG tablet Take 50 mg by mouth daily.     Omega-3 Fatty Acids (OMEGA 3 FISH OIL PO) Take 2 capsules by mouth daily.     omeprazole (PRILOSEC) 20 MG capsule Take 20 mg by mouth daily.     tamsulosin  (FLOMAX ) 0.4 MG CAPS capsule Take 0.4 mg by mouth daily.     No current facility-administered medications for this visit.     PHYSICAL EXAMINATION: ECOG PERFORMANCE STATUS: 0 - Asymptomatic  Vitals:   12/14/23 0835  BP: 119/65  Pulse: (!) 53  Resp: 18  Temp: (!) 97.1 F (36.2 C)  SpO2: 99%   Filed Weights   12/14/23 0835  Weight: 173 lb (78.5 kg)    GENERAL:alert, no distress and comfortable SKIN: skin color, texture, turgor are normal, no rashes or significant lesions EYES: normal, conjunctiva are pink and non-injected, sclera clear OROPHARYNX:no exudate, no erythema and lips, buccal mucosa, and tongue normal  NECK: supple, thyroid normal size, non-tender, without nodularity LYMPH:  no palpable lymphadenopathy in the cervical, axillary  LUNGS: clear to auscultation and percussion with normal breathing effort HEART: regular rate & rhythm and no murmurs and no lower extremity edema ABDOMEN:abdomen soft, non-tender and normal bowel sounds Musculoskeletal:no cyanosis of digits and no clubbing  PSYCH: alert & oriented x 3 with fluent speech NEURO: no focal motor/sensory deficits  LABORATORY DATA:  I have reviewed the data as listed Lab Results  Component Value Date   WBC 3.8 (L) 11/12/2021   HGB 11.1 (L) 11/12/2021   HCT 33.8 (L) 11/12/2021   MCV 91.4 11/12/2021   PLT 219 11/12/2021     Chemistry      Component Value Date/Time   NA 140 11/12/2021 1111   K 3.6 11/12/2021 1111   CL 108 11/12/2021 1111   CO2 26 11/12/2021 1111   BUN 17 11/12/2021 1111   CREATININE 1.40 (H) 11/12/2021 1111      Component Value Date/Time   CALCIUM 9.2 11/12/2021 1111   ALKPHOS 58 11/12/2021 1111   AST 19 11/12/2021 1111    ALT 20 11/12/2021 1111   BILITOT 0.5 11/12/2021 1111  RADIOGRAPHIC STUDIES: I have personally reviewed the radiological images as listed and agreed with the findings in the report. No results found.  All questions were answered. The patient knows to call the clinic with any problems, questions or concerns. I spent 30 minutes in the care of this patient including H and P, review of records, counseling and coordination of care.     Amber Stalls, MD 12/14/2023 8:39 AM

## 2023-12-15 LAB — FOLATE RBC
Folate, Hemolysate: 388 ng/mL
Folate, RBC: 1024 ng/mL (ref >498)
Hematocrit: 37.9 % (ref 37.5–51.0)

## 2023-12-15 LAB — KAPPA/LAMBDA LIGHT CHAINS
Kappa free light chain: 51.9 mg/L — ABNORMAL HIGH (ref 3.3–19.4)
Kappa, lambda light chain ratio: 1.6 (ref 0.26–1.65)
Lambda free light chains: 32.5 mg/L — ABNORMAL HIGH (ref 5.7–26.3)

## 2023-12-16 ENCOUNTER — Ambulatory Visit: Payer: Self-pay | Admitting: Hematology and Oncology

## 2023-12-16 LAB — PROTEIN ELECTROPHORESIS, SERUM, WITH REFLEX
A/G Ratio: 0.9 (ref 0.7–1.7)
Albumin ELP: 3.4 g/dL (ref 2.9–4.4)
Alpha-1-Globulin: 0.2 g/dL (ref 0.0–0.4)
Alpha-2-Globulin: 0.8 g/dL (ref 0.4–1.0)
Beta Globulin: 1.2 g/dL (ref 0.7–1.3)
Gamma Globulin: 1.8 g/dL (ref 0.4–1.8)
Globulin, Total: 3.9 g/dL (ref 2.2–3.9)
Total Protein ELP: 7.3 g/dL (ref 6.0–8.5)

## 2024-01-12 ENCOUNTER — Ambulatory Visit: Admitting: Gastroenterology

## 2024-01-28 ENCOUNTER — Ambulatory Visit: Admitting: Internal Medicine
